# Patient Record
Sex: Male | Born: 2000 | Hispanic: Yes | Marital: Single | Smoking: Never smoker
Health system: Southern US, Community
[De-identification: ages and names within clinical notes are randomized; demographics above are authoritative.]

## PROBLEM LIST (undated history)

## (undated) DIAGNOSIS — K760 Fatty (change of) liver, not elsewhere classified: Secondary | ICD-10-CM

## (undated) DIAGNOSIS — L500 Allergic urticaria: Secondary | ICD-10-CM

## (undated) DIAGNOSIS — R0989 Other specified symptoms and signs involving the circulatory and respiratory systems: Secondary | ICD-10-CM

## (undated) DIAGNOSIS — K4090 Unilateral inguinal hernia, without obstruction or gangrene, not specified as recurrent: Secondary | ICD-10-CM

## (undated) DIAGNOSIS — R079 Chest pain, unspecified: Secondary | ICD-10-CM

## (undated) DIAGNOSIS — G43909 Migraine, unspecified, not intractable, without status migrainosus: Secondary | ICD-10-CM

## (undated) DIAGNOSIS — J029 Acute pharyngitis, unspecified: Secondary | ICD-10-CM

## (undated) DIAGNOSIS — J342 Deviated nasal septum: Secondary | ICD-10-CM

## (undated) HISTORY — PX: WISDOM TOOTH EXTRACTION: SHX21

## (undated) HISTORY — DX: Allergic urticaria: L50.0

---
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
## 2020-05-08 NOTE — Progress Notes (Signed)
NEUROLOGY FOLLOW UP OFFICE NOTE  Gerald Neal [id number]  HISTORY OF PRESENT ILLNESS: Gerald Neal is an 19 year old male follows up for migraine.  He is accompanied by his mother who supplements history.  UPDATE: Improved diet and sleep, which has helped.  No severe migraines.   Intensity:  Mild to moderate Duration:  30 minutes Frequency:  1 or 2 in past 30 days No recurrence of numbness.  Frequency of abortive medication: none Current NSAIDS:  ibuprofen Current analgesics:  none Current triptans:  sumatriptan '100mg'$  (never needed it) Current ergotamine:  none Current anti-emetic:  none Current muscle relaxants:  none Current anti-anxiolytic:  none Current sleep aide:  none Current Antihypertensive medications:  none Current Antidepressant medications:  none Current Anticonvulsant medications:  none Current anti-CGRP:  none Current Vitamins/Herbal/Supplements:  none Current Antihistamines/Decongestants:   none Other therapy:  none Hormone/birth control:  none Other medications:  none  Caffeine:  No coffee.  No soda Diet:  Drinks plenty of water Exercise:  yes Depression:  no; Anxiety:  no Other pain:  no Sleep hygiene:  Varies.  He is in college.  HISTORY: Onset:  He had migraines as a child up until 66 years old.  Those headaches were bitemporal and associated with nausea and vomiting.  He has had a recurrence of headaches at age 63.  They are different.  He loses unilateral eye sight (vision black) for 30 to 45 minutes, followed by gradual onset of left sided throbbing headache that then becomes diffuse and 8/10 intensity.  They typically last 3 hours.  There is no associated nausea, vomiting, photophobia, phonophobia, or unilateral numbness or weakness.  Physical exertion may be a trigger.  The first couple of times, it occurred while working in an [employer] and another time it occurred while working out.  Nothing really helps.  He has  had maybe up to 5 over the past 2 years.  He presented to the ED on 09/10/2019 because it would not abort.  He was afebrile and did not have abnormal neurologic exam.  Alk phos and t bili were mildly elevated at 142 and 1.3 respectively, but otherwise CBC, CMP, and UA were unremarkable.  Past NSAIDS:  none Past analgesics:  Tylenol Past abortive triptans:  none Past abortive ergotamine:  none Past muscle relaxants:  none Past anti-emetic:  none Past antihypertensive medications:  none Past antidepressant medications:  none Past anticonvulsant medications:  none Past anti-CGRP:  none Past vitamins/Herbal/Supplements:  none Past antihistamines/decongestants:  none Other past therapies:  none   Family history of headache:  No  Unrelated, he mentioned that he once had numbness and tingling around the left side of his lip lasting 10 minutes in November 2020.  No associated headache.  No recurrence.      PAST MEDICAL HISTORY: Past Medical History:  Diagnosis Date  . Deviated septum 08/2018  . Recurrent urticaria 01/21/2019  . Runny nose 09/18/2018   clear drainage, per mother  . Sore throat 09/18/2018    MEDICATIONS: Current Outpatient Medications on File Prior to Visit  Medication Sig Dispense Refill  . Sodium Fluoride 0.243 % PSTE Use as directed in the mouth or throat.    . SUMAtriptan (IMITREX) 100 MG tablet Take 1 tablet earliest onset of migraine.  May repeat in 2 hours if headache persists or recurs.  Maximum 2 tablets in 24 hours. 10 tablet 3   No current facility-administered medications on file prior to visit.  ALLERGIES: No Known Allergies  FAMILY HISTORY: Family History  Problem Relation Age of Onset  . Diabetes Maternal Grandfather   . Diabetes Paternal Grandfather   . Healthy Mother   . Healthy Father   . Healthy Sister   . Allergic rhinitis Neg Hx   . Asthma Neg Hx   . Eczema Neg Hx   . Urticaria Neg Hx    SOCIAL HISTORY: Social History    Socioeconomic History  . Marital status: Single    Spouse name: Not on file  . Number of children: 0  . Years of education: Not on file  . Highest education level: High school graduate  Occupational History  . Not on file  Tobacco Use  . Smoking status: Never Smoker  . Smokeless tobacco: Never Used  Substance and Sexual Activity  . Alcohol use: No  . Drug use: No  . Sexual activity: Never  Other Topics Concern  . Not on file  Social History Narrative   Pt is single lives with parents   He has no children   He currently is enrolled in college- high school graduate.   Right handed   Drinks coffee once month, no tea, no soda   Social Determinants of Health   Financial Resource Strain:   . Difficulty of Paying Living Expenses:   Food Insecurity:   . Worried About Charity fundraiser in the Last Year:   . Arboriculturist in the Last Year:   Transportation Needs:   . Film/video editor (Medical):   Marland Kitchen Lack of Transportation (Non-Medical):   Physical Activity:   . Days of Exercise per Week:   . Minutes of Exercise per Session:   Stress:   . Feeling of Stress :   Social Connections:   . Frequency of Communication with Friends and Family:   . Frequency of Social Gatherings with Friends and Family:   . Attends Religious Services:   . Active Member of Clubs or Organizations:   . Attends Archivist Meetings:   Marland Kitchen Marital Status:   Intimate Partner Violence:   . Fear of Current or Ex-Partner:   . Emotionally Abused:   Marland Kitchen Physically Abused:   . Sexually Abused:      PHYSICAL EXAM: Blood pressure 118/69, pulse 75, resp. rate 18, height '5\' 8"'$  (1.727 m), weight 152 lb (68.9 kg), SpO2 97 %. General: No acute distress.  Patient appears well-groomed.   Head:  Normocephalic/atraumatic Eyes:  Fundi examined but not visualized Neck: supple, no paraspinal tenderness, full range of motion Heart:  Regular rate and rhythm Lungs:  Clear to auscultation bilaterally Back:  No paraspinal tenderness Neurological Exam: alert and oriented to person, place, and time. Attention span and concentration intact, recent and remote memory intact, fund of knowledge intact.  Speech fluent and not dysarthric, language intact.  CN II-XII intact. Bulk and tone normal, muscle strength 5/5 throughout.  Sensation to light touch, temperature and vibration intact.  Deep tendon reflexes 2+ throughout, toes downgoing.  Finger to nose and heel to shin testing intact.  Gait normal, Romberg negative.  IMPRESSION: Migraine with aura, without status migrainosus, not intractable.  Stable Vague brief episode of numbness of his lips.  No recurrence.    PLAN: 1.  May use ibuprofen or sumatriptan if needed (which may be managed by his PCP). 2.  Follow up as needed.  Metta Clines, DO  CC: Faith Vanita Panda, MD

## 2020-05-11 ENCOUNTER — Ambulatory Visit (INDEPENDENT_AMBULATORY_CARE_PROVIDER_SITE_OTHER): Payer: Medicaid Other | Admitting: Neurology

## 2020-05-11 ENCOUNTER — Other Ambulatory Visit: Payer: Self-pay

## 2020-05-11 ENCOUNTER — Encounter: Payer: Self-pay | Admitting: Neurology

## 2020-05-11 VITALS — BP 118/69 | HR 75 | Resp 18 | Ht 68.0 in | Wt 152.0 lb

## 2020-05-11 DIAGNOSIS — G43109 Migraine with aura, not intractable, without status migrainosus: Secondary | ICD-10-CM | POA: Diagnosis not present

## 2020-05-11 NOTE — Patient Instructions (Signed)
Follow up as needed

## 2020-05-15 ENCOUNTER — Other Ambulatory Visit: Payer: Self-pay

## 2020-05-15 ENCOUNTER — Telehealth: Payer: Self-pay | Admitting: Neurology

## 2020-05-15 ENCOUNTER — Ambulatory Visit (INDEPENDENT_AMBULATORY_CARE_PROVIDER_SITE_OTHER): Payer: Medicaid Other

## 2020-05-15 DIAGNOSIS — G43109 Migraine with aura, not intractable, without status migrainosus: Secondary | ICD-10-CM

## 2020-05-15 MED ORDER — METOCLOPRAMIDE HCL 5 MG/ML IJ SOLN
10.0000 mg | Freq: Once | INTRAMUSCULAR | Status: AC
  Administered 2020-05-15: 10 mg via INTRAMUSCULAR

## 2020-05-15 MED ORDER — TOPIRAMATE 25 MG PO TABS: 25.0000 mg | ORAL_TABLET | Freq: Every day | ORAL | 0 refills | Status: DC

## 2020-05-15 MED ORDER — KETOROLAC TROMETHAMINE 60 MG/2ML IM SOLN
60.0000 mg | Freq: Once | INTRAMUSCULAR | Status: AC
  Administered 2020-05-15: 60 mg via INTRAMUSCULAR

## 2020-05-15 MED ORDER — DIPHENHYDRAMINE HCL 50 MG/ML IJ SOLN
25.0000 mg | Freq: Once | INTRAMUSCULAR | Status: AC
  Administered 2020-05-15: 25 mg via INTRAMUSCULAR

## 2020-05-15 NOTE — Telephone Encounter (Signed)
If it is mild, then he may retake a sumatriptan.  If it is not manageable, she may bring her son in for a headache cocktail (he needs a driver).  In the meantime, I will start him on a daily preventative, topiramate 25mg  at bedtime for a week, then increase to 50mg  at bedtime.  Side effects may cause numbness and tingling, so he shouldn't be concerned if he feels this.  He should be sure to keep hydrated.

## 2020-05-15 NOTE — Telephone Encounter (Signed)
Patient came by the office for a headache cocktail with a driver. Added to schedule.

## 2020-05-15 NOTE — Telephone Encounter (Signed)
Pt mother states that the patients had a headache for the last three days now. He only took the Sumatriptan once on the 18th. And Naxproxen. The pain is mild. Pt mother is worried how the pt feels, he unable to lift  his head, and pt states he has pressure on the left side of his head.     Pt mother wants to know what to do. Appt schedule to see Dr,. Jaffe on 05/19/20

## 2020-05-15 NOTE — Telephone Encounter (Signed)
Patient called in requesting another visit with Dr. Everlena Cooper. The patient was just here on 05/11/20. I have him scheduled on Monday 05/18/20, but I was not sure if there was something else that the patient could do or if Dr. Everlena Cooper wants to see him on Monday?

## 2020-05-18 ENCOUNTER — Ambulatory Visit (INDEPENDENT_AMBULATORY_CARE_PROVIDER_SITE_OTHER): Payer: Medicaid Other | Admitting: Neurology

## 2020-05-18 ENCOUNTER — Other Ambulatory Visit: Payer: Self-pay

## 2020-05-18 ENCOUNTER — Encounter: Payer: Self-pay | Admitting: Neurology

## 2020-05-18 VITALS — BP 110/60 | HR 72 | Ht 68.0 in | Wt 151.6 lb

## 2020-05-18 DIAGNOSIS — R200 Anesthesia of skin: Secondary | ICD-10-CM | POA: Diagnosis not present

## 2020-05-18 DIAGNOSIS — G43119 Migraine with aura, intractable, without status migrainosus: Secondary | ICD-10-CM

## 2020-05-18 MED ORDER — RIZATRIPTAN BENZOATE 10 MG PO TABS: 10.0000 mg | ORAL_TABLET | ORAL | 3 refills | Status: DC | PRN

## 2020-05-18 MED ORDER — ONDANSETRON HCL 4 MG PO TABS: 4.0000 mg | ORAL_TABLET | Freq: Three times a day (TID) | ORAL | 3 refills | Status: DC | PRN

## 2020-05-18 NOTE — Patient Instructions (Addendum)
1.  For preventative management, continue topiramate, titrating to 50mg  at bedtime 2.  For rescue therapy, take rizatriptan earliest onset of migraine.  May repeat in 2 hours if needed (maximum 2 tablets in 24 hours).  Take ondansetron for nausea.  Stop sumatriptan Check MRI of brain with and without contrast. We have sent a referral to [facility] for your MRI and they will call you directly to schedule your appointment. They are located at [address] [facility]. If you need to contact them directly please call [phone]. 3.  4.  Limit use of pain relievers to no more than 2 days out of week to prevent risk of rebound or medication-overuse headache. 5.  Keep headache diary 6.  Exercise, hydration, caffeine cessation, sleep hygiene, monitor for and avoid triggers 7.  Consider:  magnesium citrate 400mg  daily, riboflavin 400mg  daily, and coenzyme Q10 100mg  three times daily 8. Always keep in mind that currently taking a hormone or birth control may be a possible trigger or aggravating factor for migraine. 9. Follow up 4 months

## 2020-05-18 NOTE — Progress Notes (Signed)
NEUROLOGY FOLLOW UP OFFICE NOTE  Gerald Neal [id number]  HISTORY OF PRESENT ILLNESS: Gerald Neal.  He is accompanied by his mother who supplements history.  Following his follow up last week, he developed another intractable Neal.  It was severe left temporal pain lasting 2 to 3 days.  His mother said that his left eye looked "larger".  He tried sumatriptan once which was ineffective.  He vomited, which did not help.  He received a headache cocktail in our office on Friday, which helped.  He now has a dull residual left sided headache but manageable.  We started topiramate, titrating to 50mg  at bedtime.    PAST MEDICAL HISTORY: Past Medical History:  Diagnosis Date  . Deviated septum 08/2018  . Recurrent urticaria 01/21/2019  . Runny nose 09/18/2018   clear drainage, per mother  . Sore throat 09/18/2018    MEDICATIONS: Current Outpatient Medications on File Prior to Visit  Medication Sig Dispense Refill  . Sodium Fluoride 0.243 % PSTE Use as directed in the mouth or throat.    . SUMAtriptan (IMITREX) 100 MG tablet Take 1 tablet earliest onset of Neal.  May repeat in 2 hours if headache persists or recurs.  Maximum 2 tablets in 24 hours. 10 tablet 3  . topiramate (TOPAMAX) 25 MG tablet Take 1 tablet (25 mg total) by mouth at bedtime. 60 tablet 0   No current facility-administered medications on file prior to visit.    ALLERGIES: No Known Allergies  FAMILY HISTORY: Family History  Problem Relation Age of Onset  . Diabetes Maternal Grandfather   . Diabetes Paternal Grandfather   . Healthy Mother   . Healthy Father   . Healthy Sister   . Allergic rhinitis Neg Hx   . Asthma Neg Hx   . Eczema Neg Hx   . Urticaria Neg Hx     SOCIAL HISTORY: Social History   Socioeconomic History  . Marital status: Single    Spouse name: Not on file  . Number of children: 0  . Years of education: Not  on file  . Highest education level: High school graduate  Occupational History  . Not on file  Tobacco Use  . Smoking status: Never Smoker  . Smokeless tobacco: Never Used  Substance and Sexual Activity  . Alcohol use: No  . Drug use: No  . Sexual activity: Never  Other Topics Concern  . Not on file  Social History Narrative   Pt is single lives with parents   He has no children   He currently is enrolled in college- high school graduate.   Right handed   Drinks coffee once month, no tea, no soda   Social Determinants of Health   Financial Resource Strain:   . Difficulty of Paying Living Expenses:   Food Insecurity:   . Worried About Charity fundraiser in the Last Year:   . Arboriculturist in the Last Year:   Transportation Needs:   . Film/video editor (Medical):   Marland Kitchen Lack of Transportation (Non-Medical):   Physical Activity:   . Days of Exercise per Week:   . Minutes of Exercise per Session:   Stress:   . Feeling of Stress :   Social Connections:   . Frequency of Communication with Friends and Family:   . Frequency of Social Gatherings with Friends and Family:   . Attends Religious Services:   .  Active Member of Clubs or Organizations:   . Attends Banker Meetings:   Marland Kitchen Marital Status:   Intimate Partner Violence:   . Fear of Current or Ex-Partner:   . Emotionally Abused:   Marland Kitchen Physically Abused:   . Sexually Abused:     REVIEW OF SYSTEMS: Constitutional: No fevers, chills, or sweats, no generalized fatigue, change in appetite Eyes: No visual changes, double vision, eye pain Ear, nose and throat: No hearing loss, ear pain, nasal congestion, sore throat Cardiovascular: No chest pain, palpitations Respiratory:  No shortness of breath at rest or with exertion, wheezes GastrointestinaI: No nausea, vomiting, diarrhea, abdominal pain, fecal incontinence Genitourinary:  No dysuria, urinary retention or frequency Musculoskeletal:  No neck pain, back  pain Integumentary: No rash, pruritus, skin lesions Neurological: as above Psychiatric: No depression, insomnia, anxiety Endocrine: No palpitations, fatigue, diaphoresis, mood swings, change in appetite, change in weight, increased thirst Hematologic/Lymphatic:  No purpura, petechiae. Allergic/Immunologic: no itchy/runny eyes, nasal congestion, recent allergic reactions, rashes  PHYSICAL EXAM: Blood pressure 110/60, pulse 72, height 5\' 8"  (1.727 m), weight 151 lb 9.6 oz (68.8 kg), SpO2 98 %. General: No acute distress.  Patient appears well-groomed.    IMPRESSION: Neal with aura, without status migrainosus, intractable  PLAN: 1.  Started topiramate, titrating to 50mg  at bedtime 2.  For rescue, he will try rizatriptan 10mg .  Ondansetron 4mg  for nausea. 3.  Limit use of pain relievers to no more than 2 days out of week to prevent risk of rebound or medication-overuse headache. 4.  Keep headache diary 5.  Given the severity and intractability of these headaches, will check MRI of brain with and without contrast. 6.  Follow up in 4 months.  Total time spent with patient and mother:  15 minutes.  , DO  CC: , MD

## 2020-05-20 ENCOUNTER — Encounter: Payer: Self-pay | Admitting: *Deleted

## 2020-05-20 NOTE — Progress Notes (Signed)
[facility] (Key: [id number]) [phone] Need help? Call us at [phone] Status Sent to [employer] The plan will fax you a determination, typically within 1 to 5 business days.  How do I follow up? Drug Rizatriptan Benzoate 10MG  tablets Form [facility] Prior Approval Request for Standard Drug Request Form Pharmacy Prior Approval Request for Standard Drug Request Form for [place] Medicaid and [place] Health Choice [phone] Original Claim Info [phone]

## 2020-05-27 ENCOUNTER — Ambulatory Visit
Admission: RE | Admit: 2020-05-27 | Discharge: 2020-05-27 | Disposition: A | Discharge status: Home / Self Care | Payer: Medicaid Other | Source: Ambulatory Visit | Attending: Neurology | Admitting: Neurology

## 2020-05-27 DIAGNOSIS — G43119 Migraine with aura, intractable, without status migrainosus: Secondary | ICD-10-CM

## 2020-05-27 DIAGNOSIS — R200 Anesthesia of skin: Secondary | ICD-10-CM

## 2020-05-27 MED ORDER — GADOBENATE DIMEGLUMINE 529 MG/ML IV SOLN
14.0000 mL | Freq: Once | INTRAVENOUS | Status: AC | PRN
  Administered 2020-05-27: 14 mL via INTRAVENOUS

## 2020-05-28 ENCOUNTER — Telehealth: Payer: Self-pay

## 2020-05-28 NOTE — Telephone Encounter (Signed)
LMOVM 3:11pm

## 2020-05-28 NOTE — Telephone Encounter (Signed)
-----   Message from Drema Dallas, DO sent at 05/28/2020 10:06 AM EDT ----- MRI normal.

## 2020-05-29 NOTE — Progress Notes (Addendum)
Received form from [facility] that additional info was needed. Faxed over needed info to them. Awaiting return determination.   Confirmation #:[id number] FBenefit Plan:DHBADHealth Plan:[employer] Prior Approval [id number] [place] Type:PHARMACYRecipient:Diante Kathie Rhodes CASTILLOGONZALERecipient [id number]:[phone] KBilling Provider:Billing Provider PF:[id number] Provider Luretha Rued JAFFERequesting Provider HG:[phone] Submission Date:05/27/2021Status:APPROVEDEffective Begin Date:05/27/2021Effective End Date:05/22/2022Payer:[place] [employer]HS DIV OF HEALTH BENEFITS# of Attachments:0

## 2020-10-08 NOTE — Progress Notes (Signed)
NEUROLOGY FOLLOW UP OFFICE NOTE  Gerald Neal [id number]  HISTORY OF PRESENT ILLNESS: Gerald Neal. Gerald Neal is an 19 year old malefollows up for migraines.He is accompanied by his mother who supplements history.  UPDATE Due to intractable migraines, MRI of brain with and without contrast was performed on 05/27/2020, which was personally reviewed and was normal  He started changing his lifestyle, eating more healthy, increasing water intake.  He took off a semester from school which has helped.  He plans on returning in January.  He was averaging 2 headaches a month.  He decided to stop topiramate about a month ago and has not had any headaches.   Frequency of abortive medication: none Current NSAIDS:ibuprofen Current analgesics:none Current triptans:rizatriptan 10mg  Current ergotamine:none Current anti-emetic:Zofran 4mg  Current muscle relaxants:none Current anti-anxiolytic:none Current sleep aide:none Current Antihypertensive medications:none Current Antidepressant medications:none Current Anticonvulsant medications:topiramate 50mg  at bedtime Current anti-CGRP:none Current Vitamins/Herbal/Supplements:none Current Antihistamines/Decongestants:none Other therapy:none Hormone/birth control:none Other medications:none  HISTORY: Onset:He had migraines as a child up until 35 years old. Those headaches were bitemporal and associated with nausea and vomiting. He has had a recurrence of headaches at age 44. They are different. He loses unilateral eye sight (vision black) for 30 to 45 minutes, followed by gradual onset of left sided throbbing headache that then becomes diffuse and 8/10 intensity. They typically last 3 hours. There is no associated nausea, vomiting, photophobia, phonophobia, or unilateral numbness or weakness. Physical exertion may be a trigger. The first couple of times, it occurred while working in an  [employer] and another time it occurred while working out. Nothing really helps. He has had maybe up to 5 over the past 2 years.  He presented to the ED on 9/15/2020because it would not abort. He was afebrile and did not have abnormal neurologic exam. Alk phos and t bili were mildly elevated at 142 and 1.3 respectively, but otherwise CBC, CMP, and UA were unremarkable.  In May 2021, he developed another intractable migraine.  It was severe left temporal pain lasting 2 to 3 days.  His mother said that his left eye looked "larger".  He tried sumatriptan once which was ineffective.  He vomited, which did not help.  He received a headache cocktail in our office on Friday, which helped.  He now has a dull residual left sided headache but manageable.  We started topiramate, titrating to 50mg  at bedtime.  Past NSAIDS:none Past analgesics:Tylenol Past abortive triptans:sumatriptan 100mg  Past abortive ergotamine:none Past muscle relaxants:none Past anti-emetic:none Past antihypertensive medications:none Past antidepressant medications:none Past anticonvulsant medications:none Past anti-CGRP:none Past vitamins/Herbal/Supplements:none Past antihistamines/decongestants:none Other past therapies:none  Family history of headache:No  Unrelated, he mentioned that he once had numbness and tingling around the left side of his lip lasting 10 minutes in November 2020. No associated headache. No recurrence.     PAST MEDICAL HISTORY: Past Medical History:  Diagnosis Date   Deviated septum 08/2018   Recurrent urticaria 01/21/2019   Runny nose 09/18/2018   clear drainage, per mother   Sore throat 09/18/2018    MEDICATIONS: Current Outpatient Medications on File Prior to Visit  Medication Sig Dispense Refill   ondansetron (ZOFRAN) 4 MG tablet Take 1 tablet (4 mg total) by mouth every 8 (eight) hours as needed for nausea or vomiting. 20 tablet 3    rizatriptan (MAXALT) 10 MG tablet Take 1 tablet (10 mg total) by mouth as needed for migraine. May repeat in 2 hours if needed 10 tablet 3   Sodium  Fluoride 0.243 % PSTE Use as directed in the mouth or throat.     topiramate (TOPAMAX) 25 MG tablet Take 1 tablet (25 mg total) by mouth at bedtime. 60 tablet 0   No current facility-administered medications on file prior to visit.    ALLERGIES: No Known Allergies  FAMILY HISTORY: Family History  Problem Relation Age of Onset   Diabetes Maternal Grandfather    Diabetes Paternal Grandfather    Healthy Mother    Healthy Father    Healthy Sister    Allergic rhinitis Neg Hx    Asthma Neg Hx    Eczema Neg Hx    Urticaria Neg Hx     SOCIAL HISTORY: Social History   Socioeconomic History   Marital status: Single    Spouse name: Not on file   Number of children: 0   Years of education: Not on file   Highest education level: High school graduate  Occupational History   Not on file  Tobacco Use   Smoking status: Never Smoker   Smokeless tobacco: Never Used  Vaping Use   Vaping Use: Never used  Substance and Sexual Activity   Alcohol use: No   Drug use: No   Sexual activity: Never  Other Topics Concern   Not on file  Social History Narrative   Pt is single lives with parents   He has no children   He currently is enrolled in college- high school graduate.   Right handed   Drinks coffee once month, no tea, no soda   Social Determinants of Health   Financial Resource Strain:    Difficulty of Paying Living Expenses: Not on file  Food Insecurity:    Worried About [place] in the Last Year: Not on file   [employer] of Food in the Last Year: Not on file  Transportation Needs:    Lack of Transportation (Medical): Not on file   Lack of Transportation (Non-Medical): Not on file  Physical Activity:    Days of Exercise per Week: Not on file   Minutes of Exercise per Session: Not on file    Stress:    Feeling of Stress : Not on file  Social Connections:    Frequency of Communication with Friends and Family: Not on file   Frequency of Social Gatherings with Friends and Family: Not on file   Attends Religious Services: Not on file   Active Member of Clubs or Organizations: Not on file   Attends Archivist Meetings: Not on file   Marital Status: Not on file  Intimate Partner Violence:    Fear of Current or Ex-Partner: Not on file   Emotionally Abused: Not on file   Physically Abused: Not on file   Sexually Abused: Not on file    PHYSICAL EXAM: Blood pressure 127/73, pulse 66, resp. rate 18, height $RemoveBe'5\' 8"'goJFwbclq$  (1.727 m), weight 153 lb (69.4 kg), SpO2 95 %. General: No acute distress.  Patient appears well-groomed.     IMPRESSION: Migraine with aura, without status migrainosus, not intractable.  Doing well off medication.  Concern would be increased migraines once he returns to school  PLAN: 1.  Lifestyle modification:  Continue healthy diet, hydration, proper sleep hygiene, routine exercise, stress reduction 2.  Migraine rescue:  Rizatriptan $RemoveBefo'10mg'gzUlhFFXeYU$  with Zofran $RemoveBef'4mg'byTAqhXKrz$  3.  Limit use of pain relievers to no more than 2 days out of week to prevent risk of rebound or medication-overuse headache. 4.  Keep headache diary  5.  Follow up 4 to 6 months.  Metta Clines, DO  CC: Faith Vanita Panda, MD

## 2020-10-12 ENCOUNTER — Other Ambulatory Visit: Payer: Self-pay

## 2020-10-12 ENCOUNTER — Ambulatory Visit (INDEPENDENT_AMBULATORY_CARE_PROVIDER_SITE_OTHER): Payer: Medicaid Other | Admitting: Neurology

## 2020-10-12 ENCOUNTER — Encounter: Payer: Self-pay | Admitting: Neurology

## 2020-10-12 VITALS — BP 127/73 | HR 66 | Resp 18 | Ht 68.0 in | Wt 153.0 lb

## 2020-10-12 DIAGNOSIS — G43109 Migraine with aura, not intractable, without status migrainosus: Secondary | ICD-10-CM

## 2020-10-12 MED ORDER — RIZATRIPTAN BENZOATE 10 MG PO TABS: 10.0000 mg | ORAL_TABLET | ORAL | 5 refills | Status: DC | PRN

## 2020-10-12 MED ORDER — ONDANSETRON HCL 4 MG PO TABS: 4.0000 mg | ORAL_TABLET | Freq: Three times a day (TID) | ORAL | 5 refills | Status: DC | PRN

## 2020-10-12 NOTE — Patient Instructions (Signed)
  1. Take rizatriptan 10mg  at earliest onset of headache.  May repeat dose once in 2 hours if needed.  Maximum 2 tablets in 24 hours. 2. Ondansetron for nausea. 3. Limit use of pain relievers to no more than 2 days out of the week.  These medications include acetaminophen, NSAIDs (ibuprofen/Advil/Motrin, naproxen/Aleve, triptans (Imitrex/sumatriptan), Excedrin, and narcotics.  This will help reduce risk of rebound headaches. 4. Be aware of common food triggers:  - Caffeine:  coffee, black tea, cola, Mt. Dew  - Chocolate  - Dairy:  aged cheeses (brie, blue, cheddar, gouda, [place], provolone, [place], Swiss, etc), chocolate milk, buttermilk, sour cream, limit eggs and yogurt  - Nuts, peanut butter  - Alcohol  - Cereals/grains:  FRESH breads (fresh bagels, sourdough, doughnuts), yeast productions  - Processed/canned/aged/cured meats (pre-packaged deli meats, hotdogs)  - MSG/glutamate:  soy sauce, flavor enhancer, pickled/preserved/marinated foods  - Sweeteners:  aspartame (Equal, Nutrasweet).  Sugar and Splenda are okay  - Vegetables:  legumes (lima beans, lentils, snow peas, fava beans, pinto peans, peas, garbanzo beans), sauerkraut, onions, olives, pickles  - Fruit:  avocados, bananas, citrus fruit (orange, lemon, grapefruit), mango  - Other:  Frozen meals, macaroni and cheese 5. Routine exercise 6. Stay adequately hydrated (aim for 64 oz water daily) 7. Keep headache diary 8. Maintain proper stress management 9. Maintain proper sleep hygiene 10. Do not skip meals 11. Consider supplements:  magnesium citrate 400mg  daily, riboflavin 400mg  daily, coenzyme Q10 100mg  three times daily. 12. Follow up in 4 to 6 months.

## 2020-12-26 DIAGNOSIS — K219 Gastro-esophageal reflux disease without esophagitis: Secondary | ICD-10-CM

## 2020-12-26 HISTORY — DX: Gastro-esophageal reflux disease without esophagitis: K21.9

## 2021-01-15 IMAGING — MR MR HEAD WO/W CM
12 series · 48 of 48 positions shown · IV contrast (14 ml Multihance)
Comparison: None.

CLINICAL DATA: Migraine.

EXAM:
MRI HEAD WITHOUT AND WITH CONTRAST
TECHNIQUE: Multiplanar, multiecho pulse sequences of the brain and surrounding
structures were obtained without and with intravenous contrast.
CONTRAST:  14mL MULTIHANCE GADOBENATE DIMEGLUMINE 529 MG/ML IV SOLN

[Series 2: t1_se_sag · sagittal · 5.0mm · 0.45mm/px · 1 of 21 slices shown]
[im 1/21]
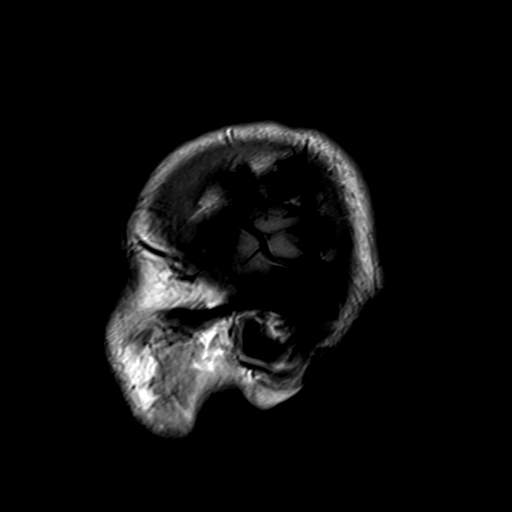

[Series 3: ep2d_diff_3 · axial · 3.0mm · 1.80mm/px · z∈[-29,+111]mm · 6 of 96 slices shown]
[im 1/96]
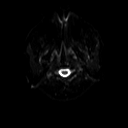
[im 20/96]
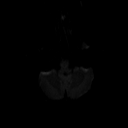
[im 39/96]
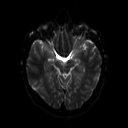
[im 58/96]
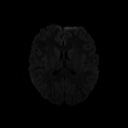
[im 77/96]
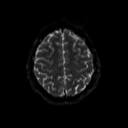
[im 96/96]
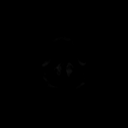

[Series 4: ep2d_diff_3_adc · axial · 3.0mm · 1.80mm/px · z∈[-29,+111]mm · 3 of 48 slices shown]
[im 1/48]
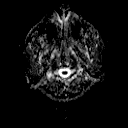
[im 24/48]
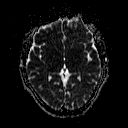
[im 48/48]
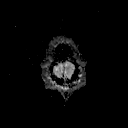

[Series 5: ep2d_diff_cor · coronal · 5.0mm · 1.77mm/px · 3 of 49 slices shown]
[im 1/49]
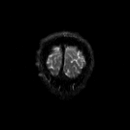
[im 25/49]
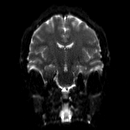
[im 49/49]
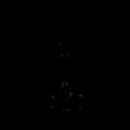

[Series 6: ep2d_diff_cor_adc · coronal · 5.0mm · 1.77mm/px · 2 of 25 slices shown]
[im 1/25]
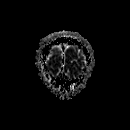
[im 25/25]
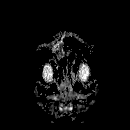

[Series 7: FLAIR · axial · 3.0mm · 0.45mm/px · z∈[-51,+134]mm · 2 of 32 slices shown]
[im 1/32]
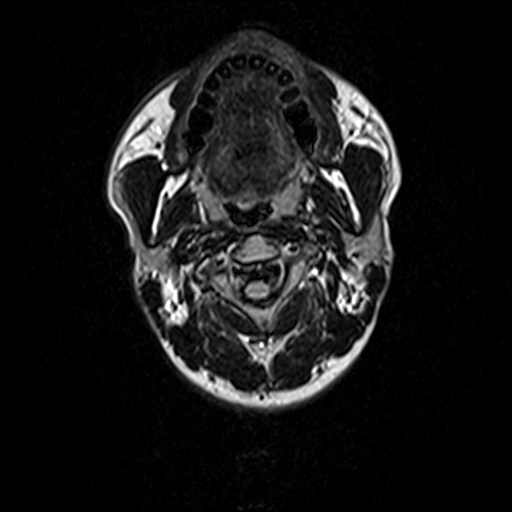
[im 32/32]
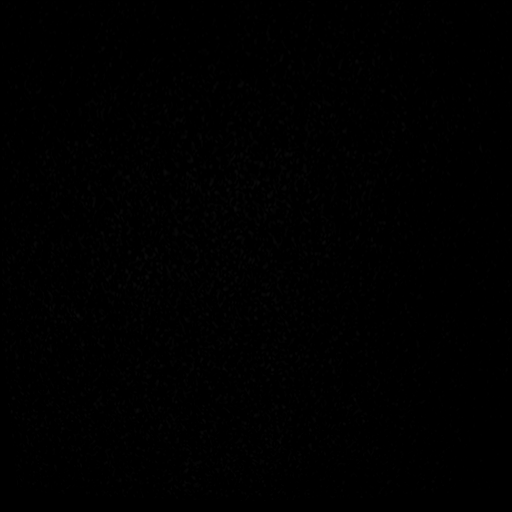

[Series 8: t2_tse_tra · axial · 5.0mm · 0.60mm/px · z∈[-33,+116]mm · 2 of 26 slices shown]
[im 1/26]
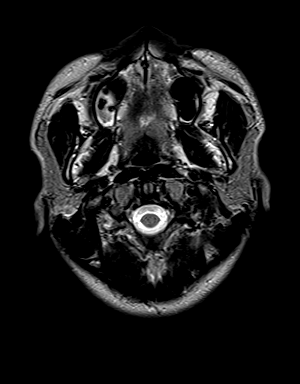
[im 26/26]
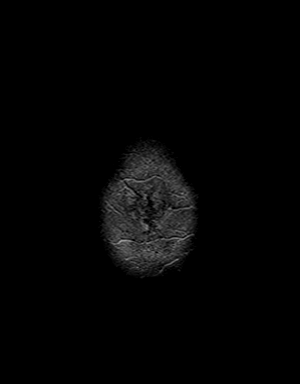

[Series 10: swi_images · axial · 2.0mm · 0.90mm/px · z∈[-37,+120]mm · 5 of 80 slices shown]
[im 1/80]
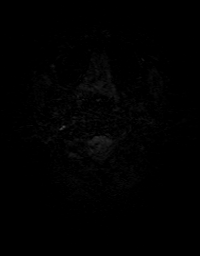
[im 20/80]
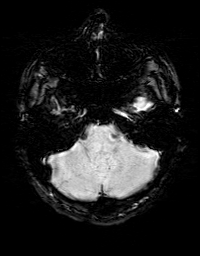
[im 40/80]
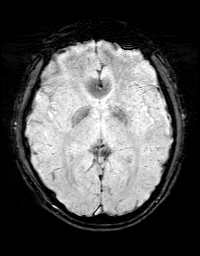
[im 60/80]
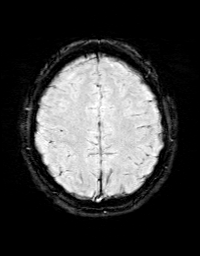
[im 80/80]
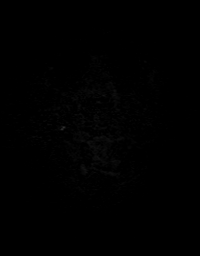

[Series 11: t1_mpr_tra · axial · 1.0mm · 0.72mm/px · z∈[-30,+112]mm · 10 of 144 slices shown]
[im 1/144]
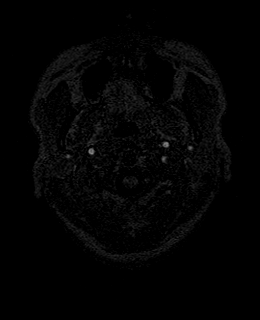
[im 16/144]
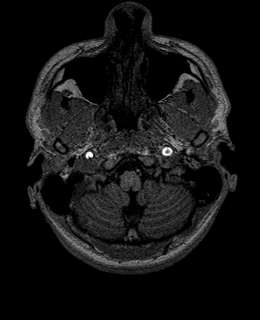
[im 32/144]
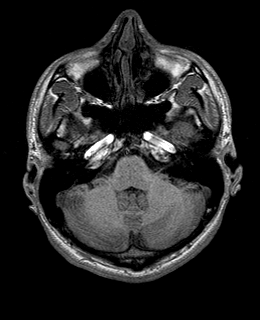
[im 48/144]
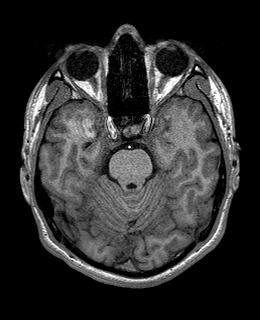
[im 64/144]
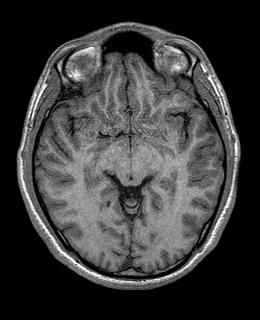
[im 80/144]
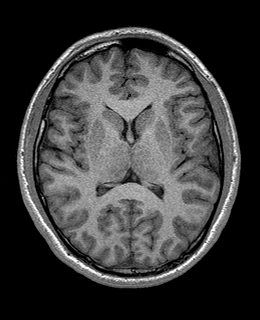
[im 96/144]
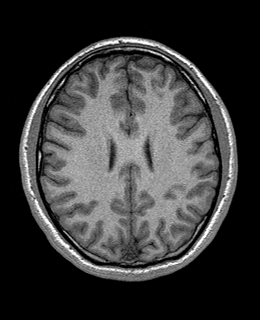
[im 112/144]
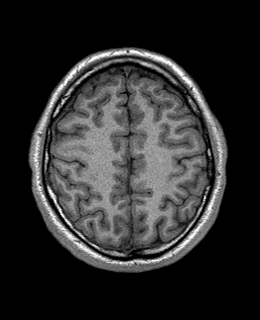
[im 128/144]
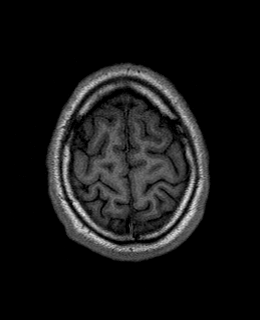
[im 144/144]
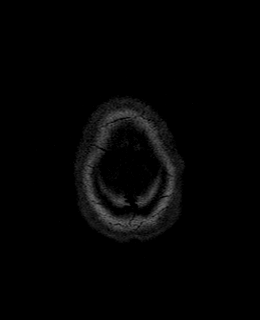

[Series 12: T2 post-contrast · coronal · 5.0mm · 0.45mm/px · 2 of 30 slices shown]
[im 1/30]
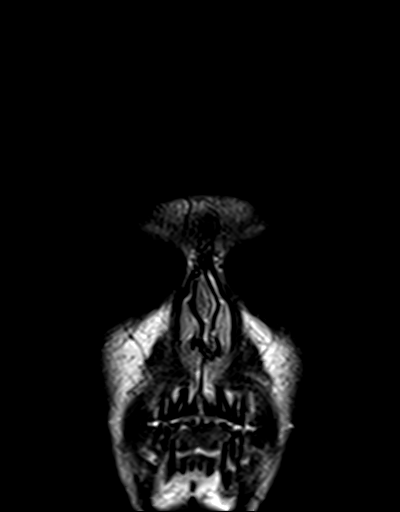
[im 30/30]
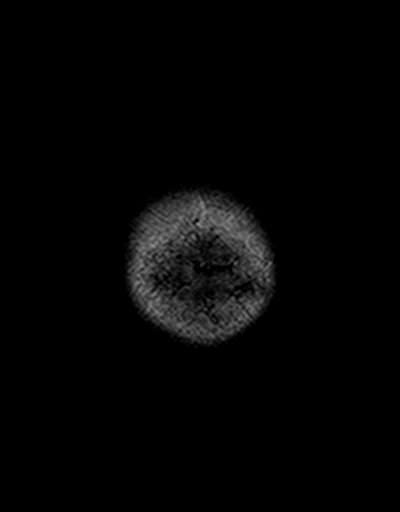

[Series 13: post t1_mpr_tra · axial · 1.0mm · 0.72mm/px · z∈[-30,+112]mm · 10 of 144 slices shown]
[im 1/144]
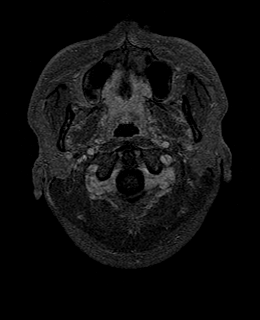
[im 16/144]
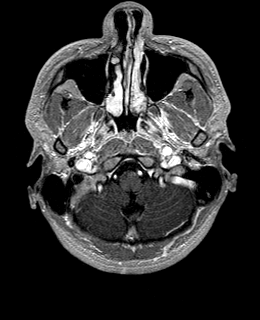
[im 32/144]
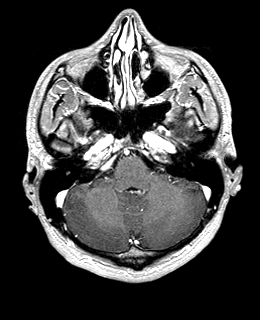
[im 48/144]
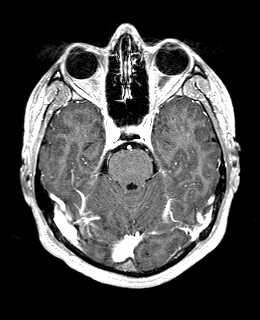
[im 64/144]
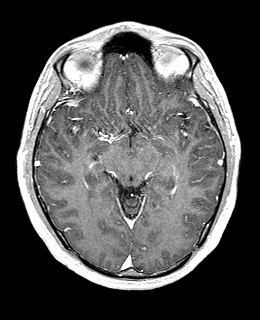
[im 80/144]
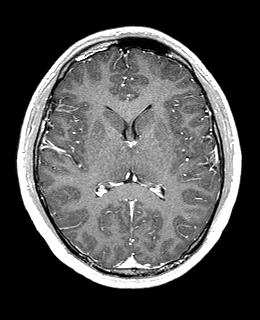
[im 96/144]
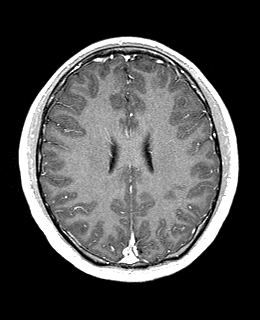
[im 112/144]
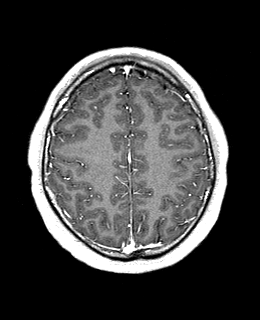
[im 128/144]
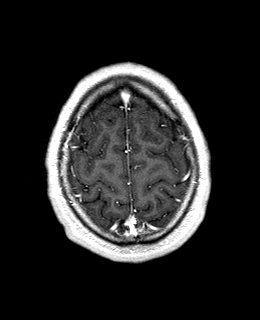
[im 144/144]
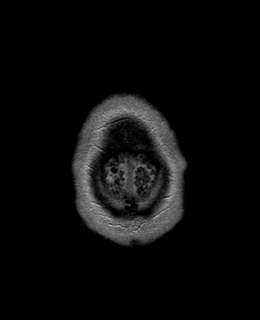

[Series 14: T1 post-contrast · coronal · 5.0mm · 0.72mm/px · 2 of 30 slices shown]
[im 1/30]
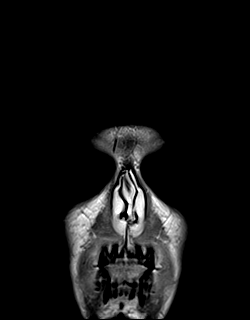
[im 30/30]
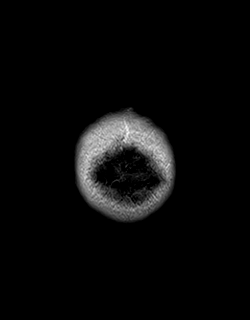

[48 of 48 positions shown; findings below may reference images not displayed]

FINDINGS: Brain: No focal parenchymal signal abnormality. No acute infarct or
intracranial hemorrhage. No midline shift, ventriculomegaly or
extra-axial fluid collection. No mass lesion. No abnormal
enhancement.

Vascular: Normal flow voids.

Skull and upper cervical spine: Normal marrow signal.

Sinuses/Orbits: Normal orbits. Clear paranasal sinuses. No mastoid
effusion.

Other: None.
IMPRESSION: Normal MRI brain.

## 2021-03-04 DIAGNOSIS — F321 Major depressive disorder, single episode, moderate: Secondary | ICD-10-CM | POA: Diagnosis not present

## 2021-04-06 ENCOUNTER — Ambulatory Visit (INDEPENDENT_AMBULATORY_CARE_PROVIDER_SITE_OTHER): Payer: Medicaid Other | Admitting: Allergy & Immunology

## 2021-04-06 ENCOUNTER — Other Ambulatory Visit: Payer: Self-pay

## 2021-04-06 ENCOUNTER — Encounter: Payer: Self-pay | Admitting: Allergy & Immunology

## 2021-04-06 VITALS — BP 118/68 | HR 81 | Temp 98.6°F | Resp 18 | Ht 68.0 in | Wt 149.0 lb

## 2021-04-06 DIAGNOSIS — N489 Disorder of penis, unspecified: Secondary | ICD-10-CM | POA: Diagnosis not present

## 2021-04-06 DIAGNOSIS — T7840XD Allergy, unspecified, subsequent encounter: Secondary | ICD-10-CM

## 2021-04-06 DIAGNOSIS — F321 Major depressive disorder, single episode, moderate: Secondary | ICD-10-CM | POA: Diagnosis not present

## 2021-04-06 MED ORDER — DESONIDE 0.05 % EX CREA: TOPICAL_CREAM | Freq: Two times a day (BID) | CUTANEOUS | 0 refills | Status: DC

## 2021-04-06 NOTE — Patient Instructions (Addendum)
1. Genital lesions - I would recommend getting latex free condoms and seeing if those work (this would answer whether this is latex or not).  - Try using desonide on the lesions twice daily for one week max to see if this helps. - Make an appointment for latex testing in 6 weeks and cancel this if the condom experiment works.   2. Return in about 6 weeks (around 05/18/2021) for LATEX TESTING.    Please inform us of any Emergency Department visits, hospitalizations, or changes in symptoms. Call us before going to the ED for breathing or allergy symptoms since we might be able to fit you in for a sick visit. Feel free to contact us anytime with any questions, problems, or concerns.  It was a pleasure to meet you today!  Websites that have reliable patient information: 1. American Academy of Asthma, Allergy, and Immunology: www.aaaai.org 2. Food Allergy Research and Education (FARE): foodallergy.org 3. Mothers of Asthmatics: http://www.asthmacommunitynetwork.org 4. American College of Allergy, Asthma, and Immunology: www.acaai.org   COVID-19 Vaccine Information can be found at: [email or web address] For questions related to vaccine distribution or appointments, please email [email or web address] or call [phone].   We realize that you might be concerned about having an allergic reaction to the COVID19 vaccines. To help with that concern, WE ARE OFFERING THE COVID19 VACCINES IN OUR OFFICE! Ask the front desk for dates!     "Like" [place] on Facebook and Instagram for our latest updates!      A healthy democracy works best when [employer] participate! Make sure you are registered to vote! If you have moved or changed any of your contact information, you will need to get this updated before voting!  In some cases, you MAY be able to register to vote online: [email or web address]

## 2021-04-06 NOTE — Progress Notes (Signed)
FOLLOW UP  Date of Service/Encounter:  04/06/21   Assessment:   Allergic reaction - with concern for latex allergy  Penile lesions  Plan/Recommendations:   1. Genital lesions - I would recommend getting latex free condoms and seeing if those work (this would answer whether this is latex or not).  - Try using desonide on the lesions twice daily for one week max to see if this helps. - Make an appointment for latex testing in 6 weeks and cancel this if the condom experiment works.   2. Return in about 6 weeks (around 05/18/2021) for LATEX TESTING.    Subjective:   Gerald Neal is a 20 y.o. male presenting today for follow up of  Chief Complaint  Patient presents with  . Rash    Gerald Neal has a history of the following: Patient Active Problem List   Diagnosis Date Noted  . Recurrent urticaria 01/21/2019  . Chronic rhinitis 01/21/2019  . History of penicillin allergy 01/21/2019  . Allergic reaction 01/21/2019  . Partial bowel obstruction (HCC) 11/11/2016    History obtained from: chart review and patient.  Gerald Neal is a 20 y.o. male presenting for a sick visit.  He was last seen in March 2020 by Dr. Nunzio Cobbs.  At that time, he underwent a penicillin challenge which he passed.  Since last visit, he has done well from an urticaria perspective. He has not had any breakthrough urticarial outbreaks and these have since resolved.   His issue today revolves around concern for a latex allergy.  He tells me that in September 2021, he went to see his girlfriend and use condom for the first time.  After using the condom, he noticed some bumps around the tip of his penis.  They were not blistery or scaly.  They were red with water-like bumps. They lasted for approximately 1 week after they first started.  He did not use anything on them.  A similar event occurred in January 2022 and the rash again lasted for 1 week.  He says the rash was pruritic and painful.   He did not use any kind of lubricant.  He has never used latex gloves to his knowledge. He did not receive any oral sex on that occasion, although he did earlier that month. There were days between the oral sex and the condom episode. This has essentially happened every time that he had sex with his girlfriend. He has NOT tried using a different type of condom. He reports that the condoms do not explicitly say that they are latex free.    He does show me pictures and actually looks like herpetic lesions.  He tells me he had a couple of STD tests performed and these were all negative.  It is unclear what exactly was done since this is not in our electronic records.  Otherwise, there have been no changes to his past medical history, surgical history, family history, or social history.    Review of Systems  Constitutional: Negative.  Negative for chills, fever, malaise/fatigue and weight loss.  HENT: Negative.  Negative for congestion, ear discharge and ear pain.   Eyes: Negative for pain, discharge and redness.  Respiratory: Negative for cough, sputum production, shortness of breath and wheezing.   Cardiovascular: Negative.  Negative for chest pain and palpitations.  Gastrointestinal: Negative for abdominal pain, heartburn, nausea and vomiting.  Skin: Positive for itching and rash.  Neurological: Negative for dizziness and headaches.  Endo/Heme/Allergies: Negative for environmental  allergies. Does not bruise/bleed easily.       Objective:   Blood pressure 118/68, pulse 81, temperature 98.6 F (37 C), temperature source Temporal, resp. rate 18, height 5\' 8"  (1.727 m), weight 149 lb (67.6 kg), SpO2 96 %. Body mass index is 22.66 kg/m.   Physical Exam:  Physical Exam Constitutional:      Appearance: He is well-developed.     Comments: Talkative male.  HENT:     Head: Normocephalic and atraumatic.     Right Ear: Tympanic membrane, ear canal and external ear normal.     Left Ear:  Tympanic membrane, ear canal and external ear normal.     Nose: No nasal deformity, septal deviation, mucosal edema or rhinorrhea.     Right Sinus: No maxillary sinus tenderness or frontal sinus tenderness.     Left Sinus: No maxillary sinus tenderness or frontal sinus tenderness.     Mouth/Throat:     Mouth: Mucous membranes are not pale and not dry.     Pharynx: Uvula midline.  Eyes:     General:        Right eye: No discharge.        Left eye: No discharge.     Conjunctiva/sclera: Conjunctivae normal.     Right eye: Right conjunctiva is not injected. No chemosis.    Left eye: Left conjunctiva is not injected. No chemosis.    Pupils: Pupils are equal, round, and reactive to light.  Cardiovascular:     Rate and Rhythm: Normal rate and regular rhythm.     Heart sounds: Normal heart sounds.  Pulmonary:     Effort: Pulmonary effort is normal. No tachypnea, accessory muscle usage or respiratory distress.     Breath sounds: Normal breath sounds. No wheezing, rhonchi or rales.  Chest:     Chest wall: No tenderness.  Genitourinary:    Comments: (seen via phone pics): multiple vesicular lesions around the glans of the penis with surrounding erythema. The shaft itself was not affected. No active drainage, although they did appear to be fluid filled vesicles. Lymphadenopathy:     Cervical: No cervical adenopathy.  Skin:    Coloration: Skin is not pale.     Findings: No abrasion, erythema, petechiae or rash. Rash is not papular, urticarial or vesicular.  Neurological:     Mental Status: He is alert.      Diagnostic studies: none     , MD  [facility]

## 2021-04-08 ENCOUNTER — Encounter: Payer: Self-pay | Admitting: Allergy & Immunology

## 2021-04-14 ENCOUNTER — Ambulatory Visit (INDEPENDENT_AMBULATORY_CARE_PROVIDER_SITE_OTHER): Payer: Medicaid Other | Admitting: Family Medicine

## 2021-04-14 ENCOUNTER — Encounter: Payer: Self-pay | Admitting: Family Medicine

## 2021-04-14 ENCOUNTER — Other Ambulatory Visit: Payer: Self-pay

## 2021-04-14 VITALS — BP 110/72 | HR 64 | Temp 99.3°F

## 2021-04-14 DIAGNOSIS — T7840XD Allergy, unspecified, subsequent encounter: Secondary | ICD-10-CM

## 2021-04-14 DIAGNOSIS — T7849XD Other allergy, subsequent encounter: Secondary | ICD-10-CM

## 2021-04-14 NOTE — Progress Notes (Addendum)
[address] Debbora Presto [place] [address] Dept: [phone]  FOLLOW UP NOTE  Patient ID: NAASIR CARREIRA, male    DOB: 25-Oct-2001  Age: 20 y.o. MRN: [id number] Date of Office Visit: 04/14/2021  Assessment  Chief Complaint: Food/Drug Challenge (Latex)  HPI Hitoshi Werts Ege is a 20 year old male who presents to the clinic for latex skin testing.  He was last seen in this clinic on 04/06/2021 by Dr. Dellis Anes for evaluation of latex allergy.  At today's visit, he reports he is feeling well overall with no integumentary, cardiopulmonary, or gastrointestinal symptoms.  He does not normally take an antihistamine and has not taken an antihistamine over the last 3 days.  He denies any further exposure to latex since his last visit to this clinic. He reports that he has not used a latex free condom or a latex condom in the interval since his last visit to this clinic. Current medications are listed in the chart.   Drug Allergies:  No Known Allergies  Physical Exam: BP 110/72 (BP Location: Left Arm, Patient Position: Sitting, Cuff Size: Normal)   Pulse 64   Temp 99.3 F (37.4 C) (Temporal)   SpO2 99%    Physical Exam Vitals reviewed.  Constitutional:      Appearance: Normal appearance.  HENT:     Head: Normocephalic and atraumatic.     Right Ear: Tympanic membrane normal.     Left Ear: Tympanic membrane normal.     Nose:     Comments: Bilateral nares normal.  Pharynx normal.  Ears normal.  Eyes normal.    Mouth/Throat:     Pharynx: Oropharynx is clear.  Eyes:     Conjunctiva/sclera: Conjunctivae normal.  Cardiovascular:     Rate and Rhythm: Normal rate and regular rhythm.     Heart sounds: Normal heart sounds. No murmur heard.   Pulmonary:     Effort: Pulmonary effort is normal.     Breath sounds: Normal breath sounds.     Comments: Lungs clear to auscultation Musculoskeletal:        General: Normal range of motion.     Cervical back: Normal range of  motion and neck supple.  Skin:    General: Skin is warm and dry.  Neurological:     Mental Status: He is alert and oriented to person, place, and time.  Psychiatric:        Mood and Affect: Mood normal.        Behavior: Behavior normal.        Thought Content: Thought content normal.        Judgment: Judgment normal.      Procedure note: Written consent obtained.  Histamine was 3+ and the following doses were negative including: control, Latex 12/998, wet glove solution, wet glove, dry glove, Latex 1/100, and Latex 1/10. The patient was able to tolerate the skin testing without adverse reaction. Physical exam remained within normal limits.   Assessment and Plan: 1. Allergic reaction, subsequent encounter     Patient Instructions  Latex skin testing Rihan Pardon was able to tolerate the latex skin testing today at the office without adverse signs or symptoms of an allergic reaction. Therefore, he has the same risk of systemic reaction associated with the use of products containing latex as the general population.  Continue to monitor for allergic symptoms such as rash, wheezing, diarrhea, swelling, and vomiting for the next 24 hours. If severe symptoms occur call 911. For less severe symptoms treat with  Benadryl 4 teaspoonfuls every 6 hours and call the clinic.   Call the clinic if this treatment plan is not working well for you  Follow up as needed   Return if symptoms worsen or fail to improve.    Thank you for the opportunity to care for this patient.  Please do not hesitate to contact me with questions.  Thermon Leyland, FNP [facility]

## 2021-04-14 NOTE — Patient Instructions (Addendum)
[name] was able to tolerate the latex skin testing today at the office without adverse signs or symptoms of an allergic reaction. Therefore, he has the same risk of systemic reaction associated with the use of products containing latex as the general population.  Continue to monitor for allergic symptoms such as rash, wheezing, diarrhea, swelling, and vomiting for the next 24 hours. If severe symptoms occur call 911. For less severe symptoms treat with Benadryl 4 teaspoonfuls every 6 hours and call the clinic.   Call the clinic if this treatment plan is not working well for you  Follow up as needed

## 2021-04-19 NOTE — Progress Notes (Signed)
Virtual Visit via Video Note The purpose of this virtual visit is to provide medical care while limiting exposure to the novel coronavirus.    Consent was obtained for video visit:  yes Answered questions that patient had about telehealth interaction:  yes I discussed the limitations, risks, security and privacy concerns of performing an evaluation and management service by telemedicine. I also discussed with the patient that there may be a patient responsible charge related to this service. The patient expressed understanding and agreed to proceed.  Pt location: Home Physician Location: office Name of referring provider:  Wende Neighbors,* I connected with Gerald Neal at patients initiation/request on 04/20/2021 at  3:30 PM EDT by video enabled telemedicine application and verified that I am speaking with the correct person using two identifiers. Pt MRN:  [id number] Pt DOB:  [birth year] Video Participants:  Gerald Neal  Assessment and Plan:   1.  Migraine with aura, without status migrainosus, not intractable 2.  Episodic tension type headache  1.  Limit use of ibuprofen to no more than 2 days out of week to prevent rebound headache 2.  Migraine rescue:  Rizatriptan 10mg  with Zofran 4mg  3.  Limit use of pain relievers to no more than 2 days out of week to prevent risk of rebound or medication-overuse headache.  4.  Keep headache diary 5.  Follow up with optometrist/ophthalmologist for formal eye exam to see if he needs new prescription 6.  Follow up with me in 6 months.  History of Present Illness:  Gerald Neal. Gerald Neal is an 20 year old malefollows up for migraines.He is accompanied by his mother who supplements history.  UPDATE No migraines in 2 months.  It quickly aborted with rizatriptan.  He may have a dull nonthrobbing bi-frontal/temporal headache for a couple of hours about 3 times a week, usually after he works out.  Treats with  ibuprofen.  Sometimes if he is doing homework late at night, his eyes strain and gets some eye pain.  Blue light filter makes headaches worse.  No recent eye exam.   Frequency of abortive medication:none Current NSAIDS:ibuprofen Current analgesics:none Current triptans:rizatriptan 10mg  Current ergotamine:none Current anti-emetic:Zofran 4mg  Current muscle relaxants:none Current anti-anxiolytic:none Current sleep aide:none Current Antihypertensive medications:none Current Antidepressant medications:none Current Anticonvulsant medications:topiramate 50mg  at bedtime Current anti-CGRP:none Current Vitamins/Herbal/Supplements:none Current Antihistamines/Decongestants:none Other therapy:none Hormone/birth control:none Other medications:none  HISTORY: Onset:He had migraines as a child up until 32 years old. Those headaches were bitemporal and associated with nausea and vomiting. He has had a recurrence of headaches at age 52. They are different. He loses unilateral eye sight (vision black) for 30 to 45 minutes, followed by gradual onset of left sided throbbing headache that then becomes diffuse and 8/10 intensity. They typically last 3 hours. There is no associated nausea, vomiting, photophobia, phonophobia, or unilateral numbness or weakness. Physical exertion may be a trigger. The first couple of times, it occurred while working in an [employer] and another time it occurred while working out. Nothing really helps. He has had maybe up to 5 over the past 2 years.  He presented to the ED on 9/15/2020because it would not abort. He was afebrile and did not have abnormal neurologic exam. Alk phos and t bili were mildly elevated at 142 and 1.3 respectively, but otherwise CBC, CMP, and UA were unremarkable.  In May 2021, he developed another intractable migraine. It was severe left temporal pain lasting 2 to 3 days. His mother said that  his  left eye looked "larger". He tried sumatriptan once which was ineffective. He vomited, which did not help. He received a headache cocktail in our office on Friday, which helped. MRI of brain with and without contrast was performed on 05/27/2020, which was personally reviewed and was normal  Past NSAIDS:none Past analgesics:Tylenol Past abortive triptans:sumatriptan 100mg  Past abortive ergotamine:none Past muscle relaxants:none Past anti-emetic:none Past antihypertensive medications:none Past antidepressant medications:none Past anticonvulsant medications:topiramate (no problems) Past anti-CGRP:none Past vitamins/Herbal/Supplements:none Past antihistamines/decongestants:none Other past therapies:none  Family history of headache:No  Unrelated, he mentioned that he once had numbness and tingling around the left side of his lip lasting 10 minutesin November 2020. No associated headache. No recurrence.  Past Medical History: Past Medical History:  Diagnosis Date  . Deviated septum 08/2018  . Recurrent urticaria 01/21/2019  . Runny nose 09/18/2018   clear drainage, per mother  . Sore throat 09/18/2018    Medications: Outpatient Encounter Medications as of 04/20/2021  Medication Sig  . desonide (DESOWEN) 0.05 % cream Apply topically 2 (two) times daily.  . ondansetron (ZOFRAN) 4 MG tablet Take 1 tablet (4 mg total) by mouth every 8 (eight) hours as needed for nausea or vomiting.  . rizatriptan (MAXALT) 10 MG tablet Take 1 tablet (10 mg total) by mouth as needed for migraine. May repeat in 2 hours if needed.  Maximum 2 tablets in 24 hours.  . Sodium Fluoride 0.243 % PSTE Use as directed in the mouth or throat.   No facility-administered encounter medications on file as of 04/20/2021.    Allergies: No Known Allergies  Family History: Family History  Problem Relation Age of Onset  . Diabetes Maternal Grandfather   . Diabetes Paternal  Grandfather   . Healthy Mother   . Healthy Father   . Healthy Sister   . Allergic rhinitis Neg Hx   . Asthma Neg Hx   . Eczema Neg Hx   . Urticaria Neg Hx     Observations/Objective:   Height 5\' 8"  (1.727 m), weight 152 lb (68.9 kg). No acute distress.  Alert and oriented.  Speech fluent and not dysarthric.  Language intact.   Follow Up Instructions:    -I discussed the assessment and treatment plan with the patient. The patient was provided an opportunity to ask questions and all were answered. The patient agreed with the plan and demonstrated an understanding of the instructions.   The patient was advised to call back or seek an in-person evaluation if the symptoms worsen or if the condition fails to improve as anticipated.    Dudley Major, DO

## 2021-04-20 ENCOUNTER — Other Ambulatory Visit: Payer: Self-pay

## 2021-04-20 ENCOUNTER — Encounter: Payer: Self-pay | Admitting: Neurology

## 2021-04-20 ENCOUNTER — Telehealth (INDEPENDENT_AMBULATORY_CARE_PROVIDER_SITE_OTHER): Payer: Medicaid Other | Admitting: Neurology

## 2021-04-20 VITALS — Ht 68.0 in | Wt 152.0 lb

## 2021-04-20 DIAGNOSIS — G44219 Episodic tension-type headache, not intractable: Secondary | ICD-10-CM | POA: Diagnosis not present

## 2021-04-20 DIAGNOSIS — G43109 Migraine with aura, not intractable, without status migrainosus: Secondary | ICD-10-CM | POA: Diagnosis not present

## 2021-06-21 ENCOUNTER — Other Ambulatory Visit: Payer: Self-pay | Admitting: Neurology

## 2021-07-21 DIAGNOSIS — N5312 Painful ejaculation: Secondary | ICD-10-CM | POA: Insufficient documentation

## 2021-10-08 DIAGNOSIS — R17 Unspecified jaundice: Secondary | ICD-10-CM | POA: Diagnosis not present

## 2021-10-08 DIAGNOSIS — R21 Rash and other nonspecific skin eruption: Secondary | ICD-10-CM | POA: Diagnosis not present

## 2021-10-08 DIAGNOSIS — R945 Abnormal results of liver function studies: Secondary | ICD-10-CM | POA: Diagnosis not present

## 2021-10-20 NOTE — Progress Notes (Signed)
NEUROLOGY FOLLOW UP OFFICE NOTE  LORIN GAWRON [id number]  Assessment/Plan:   Migraine with aura, without status migrainosus, not intractable   Migraine prevention:  Deferred as headaches are infrequent Migraine rescue:  ibuprofen or rizatriptan 29m, Zofran 424mLimit use of pain relievers to no more than 2 days out of week to prevent risk of rebound or medication-overuse headache. Keep headache diary Follow up one year   Subjective:  BrQuinton VothCaIline Ovens an 20ear old male follows up for migraines.  He is accompanied by his mother who supplements history.   UPDATE 1 migraine in last 30 days.  It quickly aborted with rizatriptan or ibuprofen.  he had one headache with right eye pain.  Frequency of abortive medication: none Current NSAIDS:  ibuprofen Current analgesics:  none Current triptans:  rizatriptan 1023murrent ergotamine:  none Current anti-emetic:  Zofran 4mg87mrrent muscle relaxants:  none Current anti-anxiolytic:  none Current sleep aide:  none Current Antihypertensive medications:  none Current Antidepressant medications:  none Current Anticonvulsant medications:  none Current anti-CGRP:  none Current Vitamins/Herbal/Supplements:  none Current Antihistamines/Decongestants:   none Other therapy:  none Hormone/birth control:  none Other medications:  none   HISTORY: Onset:  He had migraines as a child up until 20 y77rs old.  Those headaches were bitemporal and associated with nausea and vomiting.  He has had a recurrence of headaches at age 20. 62hey are different.  He loses unilateral eye sight (vision black) for 30 to 45 minutes, followed by gradual onset of left sided throbbing headache that then becomes diffuse and 8/10 intensity.  They typically last 3 hours.  There is no associated nausea, vomiting, photophobia, phonophobia, or unilateral numbness or weakness.  Physical exertion, eye strain and blue light filter may be a trigger.  The  first couple of times, it occurred while working in an [employer] another time it occurred while working out.  Nothing really helps.  He has had maybe up to 5 over the past 2 years.   He presented to the ED on 09/10/2019 because it would not abort.  He was afebrile and did not have abnormal neurologic exam.  Alk phos and t bili were mildly elevated at 142 and 1.3 respectively, but otherwise CBC, CMP, and UA were unremarkable.   In May 20, he developed another intractable migraine.  It was severe left temporal pain lasting 2 to 3 days.  His mother said that his left eye looked "larger".  He tried sumatriptan once which was ineffective.  He vomited, which did not help.  He received a headache cocktail in our office on Friday, which helped.  MRI of brain with and without contrast was performed on 05/27/2020, which was personally reviewed and was normal  For several years, he has had episodes of numbness over his lip.  They occur infrequently.  Unsure if they correlate with migraine.   Past NSAIDS:  ibuprofen Past analgesics:  Tylenol Past abortive triptans:  sumatriptan 100mg68mt abortive ergotamine:  none Past muscle relaxants:  none Past anti-emetic:  Zofran Past antihypertensive medications:  none Past antidepressant medications:  none Past anticonvulsant medications:  topiramate (no problems) Past anti-CGRP:  none Past vitamins/Herbal/Supplements:  none Past antihistamines/decongestants:  none Other past therapies:  none   Family history of headache:  No  PAST MEDICAL HISTORY: Past Medical History:  Diagnosis Date   Deviated septum 08/2018   Recurrent urticaria 01/21/2019   Runny nose 09/18/2018  clear drainage, per mother   Sore throat 09/18/2018    MEDICATIONS: Current Outpatient Medications on File Prior to Visit  Medication Sig Dispense Refill   desonide (DESOWEN) 0.05 % cream Apply topically 2 (two) times daily. (Patient not taking: Reported on 04/20/2021) 30 g 0    ondansetron (ZOFRAN) 4 MG tablet Take 1 tablet (4 mg total) by mouth every 8 (eight) hours as needed for nausea or vomiting. 20 tablet 5   rizatriptan (MAXALT) 10 MG tablet TAKE 1 TABLET BY MOUTH AS NEEDED FOR MIGRAINE. MAY REPEAT IN 2 HOURS IF NEEDED 10 tablet 2   Sodium Fluoride 0.243 % PSTE Use as directed in the mouth or throat. (Patient not taking: Reported on 04/20/2021)     No current facility-administered medications on file prior to visit.    ALLERGIES: No Known Allergies  FAMILY HISTORY: Family History  Problem Relation Age of Onset   Diabetes Maternal Grandfather    Diabetes Paternal Grandfather    Healthy Mother    Healthy Father    Healthy Sister    Allergic rhinitis Neg Hx    Asthma Neg Hx    Eczema Neg Hx    Urticaria Neg Hx       Objective:  Blood pressure 105/76, pulse 92, height _0  (1.727 m), weight 147 lb (66.7 kg), SpO2 97 %. General: No acute distress.  Patient appears well-groomed.   Head:  Normocephalic/atraumatic Eyes:  Fundi examined but not visualized Neck: supple, no paraspinal tenderness, full range of motion Heart:  Regular rate and rhythm Lungs:  Clear to auscultation bilaterally Back: No paraspinal tenderness Neurological Exam: alert and oriented to person, place, and time.  Speech fluent and not dysarthric, language intact.  CN II-XII intact. Bulk and tone normal, muscle strength 5/5 throughout.  Sensation to light touch intact.  Deep tendon reflexes 2+ throughout, toes downgoing.  Finger to nose testing intact.  Gait normal, Romberg negative.   Metta Clines, DO  CC: Christianne Borrow, MD

## 2021-10-22 ENCOUNTER — Other Ambulatory Visit: Payer: Self-pay

## 2021-10-22 ENCOUNTER — Encounter: Payer: Self-pay | Admitting: Neurology

## 2021-10-22 ENCOUNTER — Ambulatory Visit (INDEPENDENT_AMBULATORY_CARE_PROVIDER_SITE_OTHER): Payer: Medicaid Other | Admitting: Neurology

## 2021-10-22 VITALS — BP 105/76 | HR 92 | Ht 68.0 in | Wt 147.0 lb

## 2021-10-22 DIAGNOSIS — G43109 Migraine with aura, not intractable, without status migrainosus: Secondary | ICD-10-CM | POA: Diagnosis not present

## 2021-10-22 MED ORDER — ONDANSETRON HCL 4 MG PO TABS: 4.0000 mg | ORAL_TABLET | Freq: Three times a day (TID) | ORAL | 5 refills | Status: DC | PRN

## 2021-10-22 MED ORDER — RIZATRIPTAN BENZOATE 10 MG PO TABS: ORAL_TABLET | ORAL | 5 refills | Status: DC

## 2021-10-22 NOTE — Patient Instructions (Signed)
Rizatriptan and ondansetron refilled

## 2021-11-09 DIAGNOSIS — R1012 Left upper quadrant pain: Secondary | ICD-10-CM | POA: Diagnosis not present

## 2021-12-10 ENCOUNTER — Encounter (HOSPITAL_COMMUNITY): Payer: Self-pay | Admitting: Emergency Medicine

## 2021-12-10 ENCOUNTER — Other Ambulatory Visit: Payer: Self-pay

## 2021-12-10 ENCOUNTER — Ambulatory Visit (HOSPITAL_COMMUNITY)
Admission: EM | Admit: 2021-12-10 | Discharge: 2021-12-10 | Disposition: A | Discharge status: Home / Self Care | Payer: Medicaid Other | Attending: Physician Assistant | Admitting: Physician Assistant

## 2021-12-10 DIAGNOSIS — J101 Influenza due to other identified influenza virus with other respiratory manifestations: Secondary | ICD-10-CM | POA: Diagnosis not present

## 2021-12-10 DIAGNOSIS — H1033 Unspecified acute conjunctivitis, bilateral: Secondary | ICD-10-CM | POA: Diagnosis not present

## 2021-12-10 DIAGNOSIS — R051 Acute cough: Secondary | ICD-10-CM

## 2021-12-10 LAB — POC INFLUENZA A AND B ANTIGEN (URGENT CARE ONLY)
INFLUENZA A ANTIGEN, POC: POSITIVE — AB
INFLUENZA B ANTIGEN, POC: NEGATIVE

## 2021-12-10 MED ORDER — POLYMYXIN B-TRIMETHOPRIM 10000-0.1 UNIT/ML-% OP SOLN: 1.0000 [drp] | Freq: Four times a day (QID) | OPHTHALMIC | 0 refills | Status: DC

## 2021-12-10 MED ORDER — OSELTAMIVIR PHOSPHATE 75 MG PO CAPS: 75.0000 mg | ORAL_CAPSULE | Freq: Two times a day (BID) | ORAL | 0 refills | Status: DC

## 2021-12-10 NOTE — Discharge Instructions (Addendum)
You tested positive for the flu.  Please start Tamiflu twice daily.  Use over-the-counter medication for symptom management including Tylenol, ibuprofen, Mucinex, Flonase.  Make sure you rest and drink plenty of fluid.  It is possible that your eye drainage is viral and you will have to let it run its course.  Because you have so much eye drainage we are going to cover for bacteria so please use Polytrim 1 drop in each eye every 6 hours.  Use a warm compress to clean your eyes.  If you have any worsening symptoms you need to be reevaluated including high fever responding to medications, chest pain, shortness of breath, nausea/vomiting interfering with oral intake.

## 2021-12-10 NOTE — ED Provider Notes (Signed)
[facility]    CSN: [id number] Arrival date & time: 12/10/21  0845      History   Chief Complaint Chief Complaint  Patient presents with   Conjunctivitis   Nasal Congestion    HPI Gerald Neal is a 20 y.o. male.   Patient presents today with a 2-day history of URI symptoms.  Reports cough, sore throat, nasal congestion, chills, fatigue, nausea.  Denies any chest pain, shortness of breath, abdominal pain, diarrhea, vomiting.  He denies any known sick contacts.  Has not had COVID or influenza vaccine.  He has not had COVID in the past.  He reports this morning he woke up and his eyes were injected and difficult to open due to drainage; he has not tried any over-the-counter medications for the symptoms.  He does not wear glasses or contacts.  Denies any recent antibiotic use.  He has tried TheraFlu with minimal improvement of symptoms.   Past Medical History:  Diagnosis Date   Deviated septum 08/2018   Recurrent urticaria 01/21/2019   Runny nose 09/18/2018   clear drainage, per mother   Sore throat 09/18/2018    Patient Active Problem List   Diagnosis Date Noted   Recurrent urticaria 01/21/2019   Chronic rhinitis 01/21/2019   History of penicillin allergy 01/21/2019   Allergic reaction 01/21/2019   Partial bowel obstruction (HCC) 11/11/2016    Past Surgical History:  Procedure Laterality Date   APPENDECTOMY  10/31/2003   CENTRAL LINE INSERTION Right 10/31/2003   INGUINAL HERNIA REPAIR Left 03/30/2004   LAPAROSCOPY N/A 11/11/2016   Procedure: LAPAROSCOPY DIAGNOSTIC  AND EXPLORATORY LYSIS OF ADHESION;  Surgeon: Leonia Corona, MD;  Location: [facility];  Service: General;  Laterality: N/A;   SEPTOPLASTY  03/2018   SEPTOPLASTY N/A 09/25/2018   Procedure: REVISION SEPTOPLASTY;  Surgeon: Newman Pies, MD;  Location: [facility];  Service: ENT;  Laterality: N/A;       Home Medications    Prior to Admission medications   Medication Sig  Start Date End Date Taking? Authorizing Provider  oseltamivir (TAMIFLU) 75 MG capsule Take 1 capsule (75 mg total) by mouth every 12 (twelve) hours. 12/10/21  Yes Cedric Mcclaine K, PA-C  trimethoprim-polymyxin b (POLYTRIM) ophthalmic solution Place 1 drop into both eyes every 6 (six) hours. 12/10/21  Yes Modest Draeger K, PA-C  desonide (DESOWEN) 0.05 % cream Apply topically 2 (two) times daily. Patient not taking: No sig reported 04/06/21   Alfonse Spruce, MD  ondansetron (ZOFRAN) 4 MG tablet Take 1 tablet (4 mg total) by mouth every 8 (eight) hours as needed for nausea or vomiting. 10/22/21   Jaffe, Adam R, DO  rizatriptan (MAXALT) 10 MG tablet TAKE 1 TABLET BY MOUTH AS NEEDED FOR MIGRAINE. MAY REPEAT IN 2 HOURS IF NEEDED 10/22/21   Drema Dallas, DO  Sodium Fluoride 0.243 % PSTE Use as directed in the mouth or throat. Patient not taking: No sig reported    [provider]    Family History Family History  Problem Relation Age of Onset   Diabetes Maternal Grandfather    Diabetes Paternal Grandfather    Healthy Mother    Healthy Father    Healthy Sister    Allergic rhinitis Neg Hx    Asthma Neg Hx    Eczema Neg Hx    Urticaria Neg Hx     Social History Social History   Tobacco Use   Smoking status: Never   Smokeless tobacco:  Never  Vaping Use   Vaping Use: Never used  Substance Use Topics   Alcohol use: No   Drug use: No     Allergies   Patient has no known allergies.   Review of Systems Review of Systems  Constitutional:  Positive for activity change, appetite change, chills and fatigue. Negative for fever.  HENT:  Positive for congestion and sore throat. Negative for sinus pressure and sneezing.   Eyes:  Positive for discharge and redness. Negative for pain, itching and visual disturbance.  Respiratory:  Positive for cough. Negative for shortness of breath.   Cardiovascular:  Negative for chest pain.  Gastrointestinal:  Positive for nausea. Negative for  abdominal pain, diarrhea and vomiting.  Musculoskeletal:  Negative for arthralgias and myalgias.  Neurological:  Positive for headaches. Negative for dizziness and light-headedness.    Physical Exam Triage Vital Signs ED Triage Vitals  Enc Vitals Group     BP 12/10/21 0923 111/68     Pulse Rate 12/10/21 0923 71     Resp 12/10/21 0923 14     Temp 12/10/21 0923 98.6 F (37 C)     Temp Source 12/10/21 0923 Oral     SpO2 12/10/21 0923 98 %     Weight --      Height --      Head Circumference --      Peak Flow --      Pain Score 12/10/21 0922 0     Pain Loc --      Pain Edu? --      Excl. in [place]? --    No data found.  Updated Vital Signs BP 111/68 (BP Location: Left Arm)    Pulse 71    Temp 98.6 F (37 C) (Oral)    Resp 14    SpO2 98%   Visual Acuity Right Eye Distance:   Left Eye Distance:   Bilateral Distance:    Right Eye Near:   Left Eye Near:    Bilateral Near:     Physical Exam Vitals reviewed.  Constitutional:      General: He is awake.     Appearance: Normal appearance. He is well-developed. He is not ill-appearing.     Comments: Very pleasant male appears stated age in no acute distress sitting comfortably in exam room  HENT:     Head: Normocephalic and atraumatic.     Right Ear: Tympanic membrane, ear canal and external ear normal. Tympanic membrane is not erythematous or bulging.     Left Ear: Tympanic membrane, ear canal and external ear normal. Tympanic membrane is not erythematous or bulging.     Nose: Nose normal.     Mouth/Throat:     Pharynx: Uvula midline. Posterior oropharyngeal erythema present. No oropharyngeal exudate or uvula swelling.  Eyes:     Conjunctiva/sclera:     Right eye: Right conjunctiva is injected.     Left eye: Left conjunctiva is injected.     Pupils: Pupils are equal, round, and reactive to light.  Cardiovascular:     Rate and Rhythm: Normal rate and regular rhythm.     Heart sounds: Normal heart sounds, S1 normal and S2  normal. No murmur heard. Pulmonary:     Effort: Pulmonary effort is normal. No accessory muscle usage or respiratory distress.     Breath sounds: Normal breath sounds. No stridor. No wheezing, rhonchi or rales.     Comments: Clear to auscultation bilaterally Abdominal:     General:  Bowel sounds are normal.     Palpations: Abdomen is soft.     Tenderness: There is no abdominal tenderness.  Neurological:     Mental Status: He is alert.  Psychiatric:        Behavior: Behavior is cooperative.     UC Treatments / Results  Labs (all labs ordered are listed, but only abnormal results are displayed) Labs Reviewed  POC INFLUENZA A AND B ANTIGEN (URGENT CARE ONLY) - Abnormal; Notable for the following components:      Result Value   INFLUENZA A ANTIGEN, POC POSITIVE (*)    All other components within normal limits    EKG   Radiology No results found.  Procedures Procedures (including critical care time)  Medications Ordered in UC Medications - No data to display  Initial Impression / Assessment and Plan / UC Course  I have reviewed the triage vital signs and the nursing notes.  Pertinent labs & imaging results that were available during my care of the patient were reviewed by me and considered in my medical decision making (see chart for details).     Discussed likely viral etiology given short duration of symptoms.  Patient tested positive for influenza A.  Since he is within 72 hours of symptom onset we will start Tamiflu twice daily.  Recommended over-the-counter medications for symptom relief including Tylenol, Mucinex, Flonase.  Discussed that conjunctivitis could be viral in nature but given significant drainage overnight that prevented him from opening his eyes this morning we will cover for bacterial etiology with Polytrim.  He is to rest and drink plenty of fluid.  Discussed alarm symptoms that warrant emergent evaluation.  Strict return precautions given to which he  expressed understanding.  Final Clinical Impressions(s) / UC Diagnoses   Final diagnoses:  Influenza A  Acute cough  Acute conjunctivitis of both eyes, unspecified acute conjunctivitis type     Discharge Instructions      You tested positive for the flu.  Please start Tamiflu twice daily.  Use over-the-counter medication for symptom management including Tylenol, ibuprofen, Mucinex, Flonase.  Make sure you rest and drink plenty of fluid.  It is possible that your eye drainage is viral and you will have to let it run its course.  Because you have so much eye drainage we are going to cover for bacteria so please use Polytrim 1 drop in each eye every 6 hours.  Use a warm compress to clean your eyes.  If you have any worsening symptoms you need to be reevaluated including high fever responding to medications, chest pain, shortness of breath, nausea/vomiting interfering with oral intake.     ED Prescriptions     Medication Sig Dispense Auth. Provider   trimethoprim-polymyxin b (POLYTRIM) ophthalmic solution Place 1 drop into both eyes every 6 (six) hours. 10 mL Deeya Richeson K, PA-C   oseltamivir (TAMIFLU) 75 MG capsule Take 1 capsule (75 mg total) by mouth every 12 (twelve) hours. 10 capsule Zakaiya Lares, Derry Skill, PA-C      PDMP not reviewed this encounter.   Terrilee Croak, PA-C 12/10/21 1048

## 2021-12-10 NOTE — ED Triage Notes (Signed)
Pt reports that had drainage in eyes yesterday and told woke up with pink eye in both eyes. Had cold couple days ago with congestion.

## 2022-05-04 DIAGNOSIS — Z114 Encounter for screening for human immunodeficiency virus [HIV]: Secondary | ICD-10-CM | POA: Diagnosis not present

## 2022-05-04 DIAGNOSIS — Z113 Encounter for screening for infections with a predominantly sexual mode of transmission: Secondary | ICD-10-CM | POA: Diagnosis not present

## 2022-10-19 NOTE — Progress Notes (Deleted)
NEUROLOGY FOLLOW UP OFFICE NOTE  Gerald Neal [id number]  Assessment/Plan:   Migraine with aura, without status migrainosus, not intractable   Migraine prevention:  Deferred as headaches are infrequent Migraine rescue:  ibuprofen or rizatriptan $RemoveBefore'10mg'OutNTPLqQwfdp$ , Zofran $Remove'4mg'PolVQSl$  Limit use of pain relievers to no more than 2 days out of week to prevent risk of rebound or medication-overuse headache. Keep headache diary Follow up one year   Subjective:  Gerald Neal is an 21 year old male follows up for migraines.  He is accompanied by his mother who supplements history.   UPDATE 1 migraine in last 30 days.  It quickly aborted with rizatriptan or ibuprofen.  he had one headache with right eye pain.  Frequency of abortive medication: none Current NSAIDS:  ibuprofen Current analgesics:  none Current triptans:  rizatriptan $RemoveBefo'10mg'TvJBYBbplul$  Current ergotamine:  none Current anti-emetic:  Zofran $Remov'4mg'OguINX$  Current muscle relaxants:  none Current anti-anxiolytic:  none Current sleep aide:  none Current Antihypertensive medications:  none Current Antidepressant medications:  none Current Anticonvulsant medications:  none Current anti-CGRP:  none Current Vitamins/Herbal/Supplements:  none Current Antihistamines/Decongestants:   none Other therapy:  none Hormone/birth control:  none Other medications:  none   HISTORY: Onset:  He had migraines as a child up until 8 years old.  Those headaches were bitemporal and associated with nausea and vomiting.  He has had a recurrence of headaches at age 55.  They are different.  He loses unilateral eye sight (vision black) for 30 to 45 minutes, followed by gradual onset of left sided throbbing headache that then becomes diffuse and 8/10 intensity.  They typically last 3 hours.  There is no associated nausea, vomiting, photophobia, phonophobia, or unilateral numbness or weakness.  Physical exertion, eye strain and blue light filter may be a trigger.  The  first couple of times, it occurred while working in an [employer] and another time it occurred while working out.  Nothing really helps.  He has had maybe up to 5 over the past 2 years.   He presented to the ED on 09/10/2019 because it would not abort.  He was afebrile and did not have abnormal neurologic exam.  Alk phos and t bili were mildly elevated at 142 and 1.3 respectively, but otherwise CBC, CMP, and UA were unremarkable.   In May 2021, he developed another intractable migraine.  It was severe left temporal pain lasting 2 to 3 days.  His mother said that his left eye looked "larger".  He tried sumatriptan once which was ineffective.  He vomited, which did not help.  He received a headache cocktail in our office on Friday, which helped.  MRI of brain with and without contrast was performed on 05/27/2020, which was personally reviewed and was normal  For several years, he has had episodes of numbness over his lip.  They occur infrequently.  Unsure if they correlate with migraine.   Past NSAIDS:  ibuprofen Past analgesics:  Tylenol Past abortive triptans:  sumatriptan $RemoveBefo'100mg'LPLpnTKaEUz$  Past abortive ergotamine:  none Past muscle relaxants:  none Past anti-emetic:  Zofran Past antihypertensive medications:  none Past antidepressant medications:  none Past anticonvulsant medications:  topiramate (no problems) Past anti-CGRP:  none Past vitamins/Herbal/Supplements:  none Past antihistamines/decongestants:  none Other past therapies:  none   Family history of headache:  No  PAST MEDICAL HISTORY: Past Medical History:  Diagnosis Date   Deviated septum 08/2018   Recurrent urticaria 01/21/2019   Runny nose 09/18/2018  clear drainage, per mother   Sore throat 09/18/2018    MEDICATIONS: Current Outpatient Medications on File Prior to Visit  Medication Sig Dispense Refill   desonide (DESOWEN) 0.05 % cream Apply topically 2 (two) times daily. (Patient not taking: No sig reported) 30 g 0    ondansetron (ZOFRAN) 4 MG tablet Take 1 tablet (4 mg total) by mouth every 8 (eight) hours as needed for nausea or vomiting. 20 tablet 5   oseltamivir (TAMIFLU) 75 MG capsule Take 1 capsule (75 mg total) by mouth every 12 (twelve) hours. 10 capsule 0   rizatriptan (MAXALT) 10 MG tablet TAKE 1 TABLET BY MOUTH AS NEEDED FOR MIGRAINE. MAY REPEAT IN 2 HOURS IF NEEDED 10 tablet 5   Sodium Fluoride 0.243 % PSTE Use as directed in the mouth or throat. (Patient not taking: No sig reported)     trimethoprim-polymyxin b (POLYTRIM) ophthalmic solution Place 1 drop into both eyes every 6 (six) hours. 10 mL 0   No current facility-administered medications on file prior to visit.    ALLERGIES: No Known Allergies  FAMILY HISTORY: Family History  Problem Relation Age of Onset   Diabetes Maternal Grandfather    Diabetes Paternal Grandfather    Healthy Mother    Healthy Father    Healthy Sister    Allergic rhinitis Neg Hx    Asthma Neg Hx    Eczema Neg Hx    Urticaria Neg Hx       Objective:  *** General: No acute distress.  Patient appears well-groomed.   Head:  Normocephalic/atraumatic Eyes:  Fundi examined but not visualized Neck: supple, no paraspinal tenderness, full range of motion Heart:  Regular rate and rhythm Neurological Exam: alert and oriented to person, place, and time.  Speech fluent and not dysarthric, language intact.  CN II-XII intact. Bulk and tone normal, muscle strength 5/5 throughout.  Sensation to light touch intact.  Deep tendon reflexes 2+ throughout.  Finger to nose testing intact.  Gait normal, Romberg negative.   Metta Clines, DO  CC: Christianne Borrow, MD

## 2022-10-24 ENCOUNTER — Ambulatory Visit: Payer: Medicaid Other | Admitting: Neurology

## 2023-01-05 DIAGNOSIS — Z23 Encounter for immunization: Secondary | ICD-10-CM | POA: Diagnosis not present

## 2023-01-05 DIAGNOSIS — M25531 Pain in right wrist: Secondary | ICD-10-CM | POA: Diagnosis not present

## 2023-01-05 DIAGNOSIS — M25572 Pain in left ankle and joints of left foot: Secondary | ICD-10-CM | POA: Diagnosis not present

## 2023-01-11 ENCOUNTER — Ambulatory Visit (INDEPENDENT_AMBULATORY_CARE_PROVIDER_SITE_OTHER): Payer: Medicaid Other

## 2023-01-11 ENCOUNTER — Ambulatory Visit (INDEPENDENT_AMBULATORY_CARE_PROVIDER_SITE_OTHER): Payer: Medicaid Other | Admitting: Physician Assistant

## 2023-01-11 ENCOUNTER — Encounter: Payer: Self-pay | Admitting: Physician Assistant

## 2023-01-11 DIAGNOSIS — M654 Radial styloid tenosynovitis [de Quervain]: Secondary | ICD-10-CM

## 2023-01-11 DIAGNOSIS — M25572 Pain in left ankle and joints of left foot: Secondary | ICD-10-CM

## 2023-01-11 DIAGNOSIS — S96812A Strain of other specified muscles and tendons at ankle and foot level, left foot, initial encounter: Secondary | ICD-10-CM

## 2023-01-11 DIAGNOSIS — M25531 Pain in right wrist: Secondary | ICD-10-CM

## 2023-01-11 HISTORY — DX: Strain of other specified muscles and tendons at ankle and foot level, left foot, initial encounter: S96.812A

## 2023-01-11 NOTE — Progress Notes (Signed)
Office Visit Note   Patient: Gerald Neal           Date of Birth: [birth year]           MRN: [id number] Visit Date: 01/11/2023              Requested by: Donato Schultz, FNP [address] [place],  [place] [address] PCP: Donato Schultz, FNP  Chief Complaint  Patient presents with   Left Ankle - Pain   Right Wrist - Pain      HPI: Gerald Neal is a pleasant active 22 year old gentleman with a chief complaint of right radial wrist pain x 2 months and left posterior ankle pain x 2 about the same amount.  He has a history of a previous fracture in his wrist.  This was a few years ago and healed without difficulty.  He said recently he was wrestling with his girlfriend and extended his wrist.  After that he began to have pain that began at the base of his thumb and went down into his wrist.  At the time his girlfriend thought he had a little bit of swelling but no ecchymosis.  He says it does not feel like a fracture but like a tendon.  He also is complaining of left posterior ankle pain.  He is very active enjoys working out.  He admits that sometimes he lapses in working out and then he does too hard too soon.  He said he was doing an exercise up on his toes when he felt a pop in the back of his left ankle.  He did not have any profound weakness but still has pain and a popping.  He is afraid to advance his exercise program as he does not want to do any more damage  Assessment & Plan: Visit Diagnoses:  1. Pain in right wrist   2. Left ankle pain, unspecified chronicity   3. Rupture of left plantaris tendon, initial encounter     Plan: Exam for the left ankle most of his discomfort is in the left posterior ankle.  His Achilles is intact he has a negative Thompson's test.  I do think perhaps he has rupture of the plantaris.  He does have fluid in the back of the heel on x-ray.  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  Discussed all this with him.  Also discussed him with Dr. Shon Baton.  I think it would be appropriate to refer him to Dr. Shon Baton for an ultrasound evaluation of his posterior ankle to confirm a diagnosis.  May be a candidate for also Tron treatment.  Also with regards to his wrist at the same time could get a injection.  Patient is in agreement with this plan will contact him  Follow-Up Instructions: Return With Dr. Shon Baton.   Ortho Exam  Patient is alert, oriented, no adenopathy, well-dressed, normal affect, normal respiratory effort. Right wrist.  No erythema no redness no bruising.  Has a positive Finkelstein's test.  No tenderness over the hand or over the remainder of the wrist.  Negative Tinel's sign.  Sensation is intact brisk capillary refill. Left ankle no ecchymosis erythema or swelling.  He is neurovascularly intact.  No tenderness over the plantar surface of his foot.  He has a negative Thompson's test.  Cannot reproduce the popping today that he gets that relieves his pain.  Compartments  of the posterior leg are soft nontender negative Homans' sign  Imaging: XR Wrist 2 Views Right  Result Date: 01/11/2023 2 views of his right wrist demonstrate well-maintained alignment no acute fractures noted no degenerative changes  XR Ankle 2 Views Left  Result Date: 01/11/2023 2 view radiographs of his left ankle reviewed today.  Well-maintained alignment no evidence of any fracture.  On lateral he does have fluid in the posterior leg that goes down to the calcaneus.  No images are attached to the encounter.  Labs: Lab Results  Component Value Date   ESRSEDRATE 5 01/21/2019     Lab Results  Component Value Date   ALBUMIN 4.2 09/10/2019   ALBUMIN 5.0 01/21/2019   ALBUMIN 4.4 11/11/2016    No results found for: "MG" No results found for: "VD25OH"  No results found for: "PREALBUMIN"    Latest Ref Rng & Units 09/10/2019   10:59 PM 01/21/2019    3:17 PM  11/11/2016    5:56 AM  CBC EXTENDED  WBC 4.0 - 10.5 K/uL 7.8  6.3  9.8   RBC 4.22 - 5.81 MIL/uL 5.00  5.09  5.41   Hemoglobin 13.0 - 17.0 g/dL 16.4  16.3  16.5   HCT 39.0 - 52.0 % 46.5  46.2  47.8   Platelets 150 - 400 K/uL 208  285  232   NEUT# 1.4 - 7.0 x10E3/uL  3.5  8.6   Lymph# 0.7 - 3.1 x10E3/uL  2.3  1.0      There is no height or weight on file to calculate BMI.  Orders:  Orders Placed This Encounter  Procedures   XR Wrist 2 Views Right   XR Ankle 2 Views Left   Ambulatory referral to Orthopedic Surgery   No orders of the defined types were placed in this encounter.    Procedures: No procedures performed  Clinical Data: No additional findings.  ROS:  All other systems negative, except as noted in the HPI. Review of Systems  Objective: Vital Signs: There were no vitals taken for this visit.  Specialty Comments:  No specialty comments available.  PMFS History: Patient Active Problem List   Diagnosis Date Noted   Rupture of left plantaris tendon 01/11/2023   Recurrent urticaria 01/21/2019   Chronic rhinitis 01/21/2019   History of penicillin allergy 01/21/2019   Allergic reaction 01/21/2019   Partial bowel obstruction ([place]) 11/11/2016   Past Medical History:  Diagnosis Date   Deviated septum 08/2018   Recurrent urticaria 01/21/2019   Runny nose 09/18/2018   clear drainage, per mother   Sore throat 09/18/2018    Family History  Problem Relation Age of Onset   Diabetes Maternal Grandfather    Diabetes Paternal Grandfather    Healthy Mother    Healthy Father    Healthy Sister    Allergic rhinitis Neg Hx    Asthma Neg Hx    Eczema Neg Hx    Urticaria Neg Hx     Past Surgical History:  Procedure Laterality Date   APPENDECTOMY  10/31/2003   CENTRAL LINE INSERTION Right 10/31/2003   INGUINAL HERNIA REPAIR Left 03/30/2004   LAPAROSCOPY N/A 11/11/2016   Procedure: LAPAROSCOPY DIAGNOSTIC  AND EXPLORATORY LYSIS OF ADHESION;  Surgeon: Gerald Stabs, MD;  Location: [facility];  Service: General;  Laterality: N/A;   SEPTOPLASTY  03/2018   SEPTOPLASTY N/A 09/25/2018   Procedure: REVISION SEPTOPLASTY;  Surgeon: Leta Baptist, MD;  Location: [facility];  Service: ENT;  Laterality: N/A;   Social History   Occupational History   Not on file  Tobacco Use   Smoking status: Never   Smokeless tobacco: Never  Vaping Use   Vaping Use: Never used  Substance and Sexual Activity   Alcohol use: No   Drug use: No   Sexual activity: Never

## 2023-01-16 ENCOUNTER — Ambulatory Visit (INDEPENDENT_AMBULATORY_CARE_PROVIDER_SITE_OTHER): Payer: Medicaid Other | Admitting: Sports Medicine

## 2023-01-16 ENCOUNTER — Ambulatory Visit: Payer: Self-pay

## 2023-01-16 ENCOUNTER — Encounter: Payer: Self-pay | Admitting: Sports Medicine

## 2023-01-16 DIAGNOSIS — M25572 Pain in left ankle and joints of left foot: Secondary | ICD-10-CM | POA: Diagnosis not present

## 2023-01-16 DIAGNOSIS — S96812D Strain of other specified muscles and tendons at ankle and foot level, left foot, subsequent encounter: Secondary | ICD-10-CM | POA: Diagnosis not present

## 2023-01-16 DIAGNOSIS — M654 Radial styloid tenosynovitis [de Quervain]: Secondary | ICD-10-CM

## 2023-01-16 MED ORDER — METHYLPREDNISOLONE 4 MG PO TBPK: ORAL_TABLET | ORAL | 0 refills | Status: DC

## 2023-01-16 NOTE — Progress Notes (Signed)
Gerald Neal - 22 y.o. male MRN [id number]  Date of birth: 2001-05-19  Office Visit Note: Visit Date: 01/16/2023 PCP: Demetrios Isaacs, FNP Referred by: Persons, Bevely Palmer, PA  Subjective: Chief Complaint  Patient presents with   Right Wrist - Pain   Left Ankle - Pain   HPI: Gerald Neal is a pleasant 22 y.o. male who presents today for evaluation of wrist pain and posterior calf/ankle pain.   He has had right radial sided wrist pain for at least 2 months.  No specific injury, feels like this may have been exacerbated when he was wrestling around with his girlfriend and extended the wrist.  He is not taking any medication or bracing for this.  It has started slightly better.  There is radiation to the proximal aspect of the thumb.  No redness or swelling.  Left posterior ankle pain.  This started when he was working out and went up onto the balls of his feet with a pop in the back of the ankle.  He does feel like the calf is slightly weaker now. Occasionally he will feel a pop/crack around the posterior ankle/heel but is not as painful as before.  He is going to get back into working out and feels like he can, although is somewhat hesitant.  Pertinent ROS were reviewed with the patient and found to be negative unless otherwise specified above in HPI.   Assessment & Plan: Visit Diagnoses:  1. Left ankle pain, unspecified chronicity   2. Rupture of left plantaris tendon, subsequent encounter   3. De Quervain's tenosynovitis    Plan: Discussed with Linh for the likely etiologies for his wrist and lower leg/ankle pain.  His wrist pain is most indicative of de Quervain's tenosynovitis, this has been improving some with just activity modification and rest.  Discussed bracing, oral medication, injection therapy.  We will send in a 4 mg methylprednisolone taper for 6 days to help with inflammation.  This appropriate activity modification and work at [employer].  We did  ultrasound his lower leg, which shows findings of possible plantaris tearing.  Very reassuring his medial gastroc and Achilles tendon was intact without any evidence of tearing.  He did have some hypoechoic fluid change in the this area which could be a bloody versus swelling.  We did after shared decision making decided to do a trial of extracorporeal shockwave therapy.  I do think it is pertinent to get him started on a rehab program for the calf and ankle.  My athletic trainer, Lilia Pro, did review his custom printed out home rehab exercise handout and demonstrate these for him today. He is to perform once daily.  Would expect both of these conditions to improve in the short-term, if they do not he may follow-up.   Follow-up: Return if symptoms worsen or fail to improve.   Meds & Orders:  Meds ordered this encounter  Medications   methylPREDNISolone (MEDROL DOSEPAK) 4 MG TBPK tablet    Sig: Take per packet instructions. (Taper: 6 tabs on day 1, 5 tabs on day 2,....)    Dispense:  1 each    Refill:  0    Orders Placed This Encounter  Procedures   [place] Extrem Low Left Ltd     Procedures: Procedure: ECSWT Indications:  posterior ankle/plantaris injury   Procedure Details Consent: Risks of procedure as well as the alternatives and risks of each were explained to the patient.  Verbal consent for procedure  obtained. Time Out: Verified patient identification, verified procedure, site was marked, verified correct patient position. The area was cleaned with alcohol swab.     The left gastroc MT junction and plantaris/achilles junction was targeted for Extracorporeal shockwave therapy.    Preset: status post muscular injury Power Level: 90 - 100 mJ Frequency: 10-11 Hz Impulse/cycles: 2500 Head size: Regular   Patient tolerated procedure well without immediate complications.       Clinical History: No specialty comments available.  He reports that he has never smoked. He has never used  smokeless tobacco. No results for input(s): "HGBA1C", "LABURIC" in the last 8760 hours.  Objective:    Physical Exam  Gen: Well-appearing, in no acute distress; non-toxic CV: Regular Rate. Well-perfused. Warm.  Resp: Breathing unlabored on room air; no wheezing. Psych: Fluid speech in conversation; appropriate affect; normal thought process Neuro: Sensation intact throughout. No gross coordination deficits.   Ortho Exam - Right wrist: There is some generalized TTP over the radial aspect of the wrist near the first and second dorsal compartment.  No scaphoid tubercle TTP, negative CMC grind test.  Full range of motion about the thumb and wrist.  Positive Finkelstein's test.   - Left ankle/LE: There is no soft tissue swelling or redness.  No bony TTP.  There is some mild TTP more so in the very medial aspect of the myotendinous junction of the Achilles and posterior calf musculature.  Able to perform bilateral heel raise, although left is weaker than the right.  No ankle instability with ligamentous testing. NVI.  Imaging: [place] Extrem Low Left Ltd  Result Date: 01/16/2023 Limited musculoskeletal ultrasound of the left lower extremity was performed.  Evaluation of the calcaneus was noted in short and long axis without cortical regularity.  The Achilles tendon was viewed in short and long axis without evidence of tearing and proper distal insertion on the calcaneus.  There is a mild degree of hyperemia in the retrocalcaneal bursa.  No thickness of the Achilles tendon at 0.50 cm in diameter.  Scanning proximally up to There is no tearing of the medial head of the gastroc at the myotendinous junction.  Just distal to this near the transition where a plantaris tendon may lie, there is some hypoechoic fluid change and notable hyperemia, suggestive of possible plantaris tendon tear.      2 view radiographs of his left ankle reviewed today.  Well-maintained  alignment no evidence of any fracture.  On  lateral he does have fluid in  the posterior leg that goes down to the calcaneus.   Past Medical/Family/Surgical/Social History: Medications & Allergies reviewed per EMR, new medications updated. Patient Active Problem List   Diagnosis Date Noted   Rupture of left plantaris tendon 01/11/2023   De Quervain's tenosynovitis 01/11/2023   Recurrent urticaria 01/21/2019   Chronic rhinitis 01/21/2019   History of penicillin allergy 01/21/2019   Allergic reaction 01/21/2019   Partial bowel obstruction ([place]) 11/11/2016   Past Medical History:  Diagnosis Date   Deviated septum 08/2018   Recurrent urticaria 01/21/2019   Runny nose 09/18/2018   clear drainage, per mother   Sore throat 09/18/2018   Family History  Problem Relation Age of Onset   Diabetes Maternal Grandfather    Diabetes Paternal Grandfather    Healthy Mother    Healthy Father    Healthy Sister    Allergic rhinitis Neg Hx    Asthma Neg Hx    Eczema Neg Hx    Urticaria  Neg Hx    Past Surgical History:  Procedure Laterality Date   APPENDECTOMY  10/31/2003   CENTRAL LINE INSERTION Right 10/31/2003   INGUINAL HERNIA REPAIR Left 03/30/2004   LAPAROSCOPY N/A 11/11/2016   Procedure: LAPAROSCOPY DIAGNOSTIC  AND EXPLORATORY LYSIS OF ADHESION;  Surgeon: Leonia Corona, MD;  Location: [facility];  Service: General;  Laterality: N/A;   SEPTOPLASTY  03/2018   SEPTOPLASTY N/A 09/25/2018   Procedure: REVISION SEPTOPLASTY;  Surgeon: Newman Pies, MD;  Location: [facility];  Service: ENT;  Laterality: N/A;   Social History   Occupational History   Not on file  Tobacco Use   Smoking status: Never   Smokeless tobacco: Never  Vaping Use   Vaping Use: Never used  Substance and Sexual Activity   Alcohol use: No   Drug use: No   Sexual activity: Never

## 2023-01-16 NOTE — Progress Notes (Signed)
Right wrist and left ankle pain Pain has been present since around November  No injury recently to either  History of wrist fracture in 2020

## 2023-01-19 DIAGNOSIS — Z0000 Encounter for general adult medical examination without abnormal findings: Secondary | ICD-10-CM | POA: Diagnosis not present

## 2023-01-19 DIAGNOSIS — Z130 Encounter for screening for diseases of the blood and blood-forming organs and certain disorders involving the immune mechanism: Secondary | ICD-10-CM | POA: Diagnosis not present

## 2023-01-19 DIAGNOSIS — Z113 Encounter for screening for infections with a predominantly sexual mode of transmission: Secondary | ICD-10-CM | POA: Diagnosis not present

## 2023-01-19 DIAGNOSIS — Z1159 Encounter for screening for other viral diseases: Secondary | ICD-10-CM | POA: Diagnosis not present

## 2023-01-19 DIAGNOSIS — K219 Gastro-esophageal reflux disease without esophagitis: Secondary | ICD-10-CM | POA: Diagnosis not present

## 2023-01-19 DIAGNOSIS — H538 Other visual disturbances: Secondary | ICD-10-CM | POA: Diagnosis not present

## 2023-01-19 DIAGNOSIS — Z13220 Encounter for screening for lipoid disorders: Secondary | ICD-10-CM | POA: Diagnosis not present

## 2023-01-19 DIAGNOSIS — Z131 Encounter for screening for diabetes mellitus: Secondary | ICD-10-CM | POA: Diagnosis not present

## 2023-01-19 DIAGNOSIS — Z1329 Encounter for screening for other suspected endocrine disorder: Secondary | ICD-10-CM | POA: Diagnosis not present

## 2023-05-18 DIAGNOSIS — F459 Somatoform disorder, unspecified: Secondary | ICD-10-CM | POA: Diagnosis not present

## 2023-05-18 DIAGNOSIS — R1013 Epigastric pain: Secondary | ICD-10-CM | POA: Diagnosis not present

## 2023-05-18 DIAGNOSIS — N369 Urethral disorder, unspecified: Secondary | ICD-10-CM | POA: Diagnosis not present

## 2023-05-18 DIAGNOSIS — M25572 Pain in left ankle and joints of left foot: Secondary | ICD-10-CM | POA: Diagnosis not present

## 2023-05-18 DIAGNOSIS — F411 Generalized anxiety disorder: Secondary | ICD-10-CM | POA: Diagnosis not present

## 2023-05-18 DIAGNOSIS — N63 Unspecified lump in unspecified breast: Secondary | ICD-10-CM | POA: Diagnosis not present

## 2023-05-19 DIAGNOSIS — M25572 Pain in left ankle and joints of left foot: Secondary | ICD-10-CM | POA: Insufficient documentation

## 2023-05-19 DIAGNOSIS — N63 Unspecified lump in unspecified breast: Secondary | ICD-10-CM | POA: Insufficient documentation

## 2023-05-19 DIAGNOSIS — N369 Urethral disorder, unspecified: Secondary | ICD-10-CM | POA: Insufficient documentation

## 2023-05-19 DIAGNOSIS — F411 Generalized anxiety disorder: Secondary | ICD-10-CM | POA: Insufficient documentation

## 2023-05-26 ENCOUNTER — Encounter: Payer: Self-pay | Admitting: Sports Medicine

## 2023-05-26 ENCOUNTER — Ambulatory Visit (INDEPENDENT_AMBULATORY_CARE_PROVIDER_SITE_OTHER): Payer: Medicaid Other | Admitting: Sports Medicine

## 2023-05-26 DIAGNOSIS — M2141 Flat foot [pes planus] (acquired), right foot: Secondary | ICD-10-CM

## 2023-05-26 DIAGNOSIS — M62562 Muscle wasting and atrophy, not elsewhere classified, left lower leg: Secondary | ICD-10-CM | POA: Diagnosis not present

## 2023-05-26 DIAGNOSIS — G8929 Other chronic pain: Secondary | ICD-10-CM

## 2023-05-26 DIAGNOSIS — M2142 Flat foot [pes planus] (acquired), left foot: Secondary | ICD-10-CM | POA: Diagnosis not present

## 2023-05-26 DIAGNOSIS — M25572 Pain in left ankle and joints of left foot: Secondary | ICD-10-CM | POA: Diagnosis not present

## 2023-05-26 NOTE — Progress Notes (Signed)
He was doing good since last visit. Doing HEP as shown As of two days ago, he tweaked it and felt another pop Pain moved further down his ankle to his heel/achilles  Complains that the ankle "pops" all the time  But does feel like it is getting stronger

## 2023-05-26 NOTE — Progress Notes (Signed)
Gerald Neal - 22 y.o. male MRN [id number]  Date of birth: 05-25-2001  Office Visit Note: Visit Date: 05/26/2023 PCP: Donato Schultz, FNP Referred by: Donato Schultz, FNP  Subjective: Chief Complaint  Patient presents with   Left Ankle - Follow-up   HPI: Gerald Neal is a pleasant 22 y.o. male who presents today for evaluation of left posterior ankle pain.  Gerald Neal saw me back in January after having a popping pain over the posterior ankle that extended up to the distal calf.  We did ultrasound at that time and showed some hypoechoic fluid change within the distal calf and proximal Achilles region without evidence of tearing.  Question whether this was a plantaris tendon rupture.  We did give him home exercises which she has been doing religiously, feels like the Was getting much stronger but still feels like the musculature is slightly weaker on the left compared to his right.  Unfortunately he has continued having some clicking/popping sensation in a few days ago he reaggravated this.  Not taking medication for this.  He is very active with weightlifting.  Does note he has been told he has flatfeet in the past, he does have orthotics/inserts that he wears at times.  Pertinent ROS were reviewed with the patient and found to be negative unless otherwise specified above in HPI.   Assessment & Plan: Visit Diagnoses:  1. Chronic pain of left ankle   2. Left calf atrophy   3. Pes planus of both feet    Plan: [name] has had an exacerbation of his chronic posterior left ankle pain -he has been making improvements with strength and function from his home rehab, but still getting clicking and popping in the posterior aspect of the ankle.  Previous ultrasound did show some concern for possible plantaris tear as he did have hypoechoic fluid in this region surrounding the calf musculature.  At this point given the chronicity and his reaggravation, I think it is pertinent to  obtain MRI of the ankle.  Did review his prior x-rays today which show no bony abnormality.  He will continue with his home exercises for the calf and Achilles for strengthening.  Did add towel scrunches to help support his arch and the plantar fascia today.  May use over-the-counter anti-inflammatories as needed.  Did discuss the role for extracorporeal shockwave therapy to help break up any scar tissue or underlying tendon damage, may consider this in the future but will obtain MRI first to better evaluate pathology.  Follow-up: Return for f/u 5 days after MRI of ankle.   Meds & Orders: No orders of the defined types were placed in this encounter.   Orders Placed This Encounter  Procedures   MR Ankle Left w/o contrast     Procedures: No procedures performed      Clinical History: No specialty comments available.  He reports that he has never smoked. He has never used smokeless tobacco. No results for input(s): "HGBA1C", "LABURIC" in the last 8760 hours.  Objective:   Vital Signs: There were no vitals taken for this visit.  Physical Exam  Gen: Well-appearing, in no acute distress; non-toxic CV: Well-perfused. Warm.  Resp: Breathing unlabored on room air; no wheezing. Psych: Fluid speech in conversation; appropriate affect; normal thought process Neuro: Sensation intact throughout. No gross coordination deficits.   Ortho Exam - Left ankle/leg: Evaluation of the left leg does show a very mild degree of less muscle bulk of the calf musculature  on the left compared to the right.  There is no specific tenderness to palpation.  Patient is able to perform bilateral heel raise, posterior tibial tendon fires appropriately.  There is moderate pes planus upon standing.  Full range of motion about the ankle in all directions, ligamentously intact with inversion/eversion stress testing.  Imaging:  *Reviewed [place] from 01/16/23: [place] Extrem Low Left Ltd Limited musculoskeletal ultrasound of the  left lower extremity was  performed.  Evaluation of the calcaneus was noted in short and long axis  without cortical regularity.  The Achilles tendon was viewed in short and  long axis without evidence of tearing and proper distal insertion on the  calcaneus.  There is a mild degree of hyperemia in the retrocalcaneal  bursa.  No thickness of the Achilles tendon at 0.50 cm in diameter.   Scanning proximally up to There is no tearing of the medial head of the  gastroc at the myotendinous junction.  Just distal to this near the  transition where a plantaris tendon may lie, there is some hypoechoic  fluid change and notable hyperemia, suggestive of possible plantaris  tendon tear.   XR of left ankle, 2-views from 01/11/23: 2 view radiographs of his left ankle reviewed today.  Well-maintained  alignment no evidence of any fracture.  On lateral he does have fluid in  the posterior leg that goes down to the calcaneus.   Past Medical/Family/Surgical/Social History: Medications & Allergies reviewed per EMR, new medications updated. Patient Active Problem List   Diagnosis Date Noted   Rupture of left plantaris tendon 01/11/2023   De Quervain's tenosynovitis 01/11/2023   Recurrent urticaria 01/21/2019   Chronic rhinitis 01/21/2019   History of penicillin allergy 01/21/2019   Allergic reaction 01/21/2019   Partial bowel obstruction ([facility]) 11/11/2016   Past Medical History:  Diagnosis Date   Deviated septum 08/2018   Recurrent urticaria 01/21/2019   Runny nose 09/18/2018   clear drainage, per mother   Sore throat 09/18/2018   Family History  Problem Relation Age of Onset   Diabetes Maternal Grandfather    Diabetes Paternal Grandfather    Healthy Mother    Healthy Father    Healthy Sister    Allergic rhinitis Neg Hx    Asthma Neg Hx    Eczema Neg Hx    Urticaria Neg Hx    Past Surgical History:  Procedure Laterality Date   APPENDECTOMY  10/31/2003   CENTRAL LINE INSERTION Right  10/31/2003   INGUINAL HERNIA REPAIR Left 03/30/2004   LAPAROSCOPY N/A 11/11/2016   Procedure: LAPAROSCOPY DIAGNOSTIC  AND EXPLORATORY LYSIS OF ADHESION;  Surgeon: Leonia Corona, MD;  Location: [facility];  Service: General;  Laterality: N/A;   SEPTOPLASTY  03/2018   SEPTOPLASTY N/A 09/25/2018   Procedure: REVISION SEPTOPLASTY;  Surgeon: Newman Pies, MD;  Location: [facility];  Service: ENT;  Laterality: N/A;   Social History   Occupational History   Not on file  Tobacco Use   Smoking status: Never   Smokeless tobacco: Never  Vaping Use   Vaping Use: Never used  Substance and Sexual Activity   Alcohol use: No   Drug use: No   Sexual activity: Never

## 2023-06-23 ENCOUNTER — Ambulatory Visit
Admission: RE | Admit: 2023-06-23 | Discharge: 2023-06-23 | Disposition: A | Discharge status: Home / Self Care | Payer: Medicaid Other | Source: Ambulatory Visit | Attending: Sports Medicine | Admitting: Sports Medicine

## 2023-06-23 DIAGNOSIS — M25572 Pain in left ankle and joints of left foot: Secondary | ICD-10-CM | POA: Diagnosis not present

## 2023-06-23 DIAGNOSIS — M62562 Muscle wasting and atrophy, not elsewhere classified, left lower leg: Secondary | ICD-10-CM

## 2023-06-23 DIAGNOSIS — G8929 Other chronic pain: Secondary | ICD-10-CM

## 2023-06-30 ENCOUNTER — Other Ambulatory Visit: Payer: Self-pay | Admitting: Family

## 2023-06-30 DIAGNOSIS — N62 Hypertrophy of breast: Secondary | ICD-10-CM | POA: Diagnosis not present

## 2023-06-30 DIAGNOSIS — N644 Mastodynia: Secondary | ICD-10-CM | POA: Diagnosis not present

## 2023-07-03 ENCOUNTER — Ambulatory Visit: Payer: Medicaid Other | Admitting: Sports Medicine

## 2023-07-05 ENCOUNTER — Ambulatory Visit: Payer: Medicaid Other | Admitting: Sports Medicine

## 2023-07-11 ENCOUNTER — Ambulatory Visit (INDEPENDENT_AMBULATORY_CARE_PROVIDER_SITE_OTHER): Payer: Medicaid Other | Admitting: Sports Medicine

## 2023-07-11 ENCOUNTER — Encounter: Payer: Self-pay | Admitting: Sports Medicine

## 2023-07-11 DIAGNOSIS — M2142 Flat foot [pes planus] (acquired), left foot: Secondary | ICD-10-CM | POA: Diagnosis not present

## 2023-07-11 DIAGNOSIS — M76822 Posterior tibial tendinitis, left leg: Secondary | ICD-10-CM

## 2023-07-11 DIAGNOSIS — G8929 Other chronic pain: Secondary | ICD-10-CM

## 2023-07-11 DIAGNOSIS — M25572 Pain in left ankle and joints of left foot: Secondary | ICD-10-CM | POA: Diagnosis not present

## 2023-07-11 DIAGNOSIS — M2141 Flat foot [pes planus] (acquired), right foot: Secondary | ICD-10-CM | POA: Diagnosis not present

## 2023-07-11 NOTE — Progress Notes (Addendum)
Gerald Neal - 22 y.o. male MRN [id number]  Date of birth: 21-Feb-2001  Office Visit Note: Visit Date: 07/11/2023 PCP: Donato Schultz, FNP Referred by: Donato Schultz, FNP  Subjective: No chief complaint on file.  HPI: Gerald Neal is a pleasant 22 y.o. male who presents today for follow-up of chronic left posterior ankle pain.  Gerald Neal has been noticing improvement in his strength and function of the posterior ankle.  He is having less popping than he had in the past.  He has been very consistent with the home exercises and Achilles strengthening exercises at the gym.  Did have an MRI performed, here for evaluation of this today.  Noticing more muscle bulk of the calf musculature as well.  Pertinent ROS were reviewed with the patient and found to be negative unless otherwise specified above in HPI.   Assessment & Plan: Visit Diagnoses:  1. Pes planus of both feet   2. Chronic pain of left ankle   3. Posterior tibial tendinitis of left lower extremity    Plan: Disussed with Gerald Neal nature of his chronic left ankle pain.  I do think he in the past suffered a plantaris injury as he did have some scar tissue in her ultrasound examination and less muscle bulk.  He has advanced through some home rehab exercises we have provided for him, I did alter this today and would like him to include posterior tibial tendon exercises, Gerald Neal heel drops with both concentric and eccentric exercise, as well as incorporating jump rope to help strengthen, he is agreeable to this.  We did review his MRI which reassured against any tendon tearing, Achilles or other intra-articular pathology.  Will allow him to progress through his home rehab and continue building muscle bulk and instability.  Discussed if he is not making the progress that he is continuing to make, we could always consider a few sessions of extracorporeal shockwave therapy to help augment healing.  He will call or message  me if he desires this, otherwise we will follow-up as needed.  Follow-up: Return if symptoms worsen or fail to improve.   Meds & Orders: No orders of the defined types were placed in this encounter.  No orders of the defined types were placed in this encounter.    Procedures: No procedures performed      Clinical History: No specialty comments available.  He reports that he has never smoked. He has never used smokeless tobacco. No results for input(s): "HGBA1C", "LABURIC" in the last 8760 hours.  Objective:   Vital Signs: There were no vitals taken for this visit.  Physical Exam  Gen: Well-appearing, in no acute distress; non-toxic CV: Regular Rate. Well-perfused. Warm.  Resp: Breathing unlabored on room air; no wheezing. Psych: Fluid speech in conversation; appropriate affect; normal thought process Neuro: Sensation intact throughout. No gross coordination deficits.   Ortho Exam - Left ankle/leg: There is improved muscle bulk although still very trivial amount of calf atrophy on the left compared to the right.  There is no specific tenderness to palpation of the calf musculature or palpating down the Achilles tendon or posterior tibial tendon.  There is full range of motion with dorsiflexion and plantarflexion of the foot.  Ligamentously intact.  He does appropriately fire his posterior tibial tendon although with repetitive use does lose some of the inversion indicative of the a mild degree of posterior tibial tendon dysfunction.  Imaging:  MR Ankle Left w/o contrast CLINICAL DATA:  Ankle pain, tendon abnormality suspected, questionable plantaris injury.  EXAM: MRI OF THE LEFT ANKLE WITHOUT CONTRAST  TECHNIQUE: Multiplanar, multisequence MR imaging of the ankle was performed. No intravenous contrast was administered.  COMPARISON:  Ankle radiographs dated January 11, 2023  FINDINGS: TENDONS  Peroneal: Peroneal longus tendon intact. Peroneal brevis  intact.  Posteromedial: Trace amount of fluid along the tibialis posterior suggesting tenosynovitis without evidence of tear. Flexor hallucis longus tendon intact. Flexor digitorum longus tendon intact.  Anterior: Tibialis anterior tendon intact. Extensor hallucis longus tendon intact Extensor digitorum longus tendon intact.  Achilles:  Intact.  Plantar Fascia: Intact.  LIGAMENTS  Lateral: Anterior talofibular ligament intact. Calcaneofibular ligament intact. Posterior talofibular ligament intact. Anterior and posterior tibiofibular ligaments intact.  Medial: Deltoid ligament intact. Spring ligament intact.  CARTILAGE  Ankle Joint: No joint effusion. Normal ankle mortise. No chondral defect.  Subtalar Joints/Sinus Tarsi: Normal subtalar joints. No subtalar joint effusion. Normal sinus tarsi.  Bones: No marrow signal abnormality.  No fracture or dislocation.  Soft Tissue: No fluid collection or hematoma. Muscles are normal without edema or atrophy. Tarsal tunnel is normal.  IMPRESSION: 1. Trace amount of fluid along the tibialis posterior suggesting tenosynovitis without evidence of tear. 2. No evidence of tendon tear. 3. Ligaments of the ankle are intact. 4. No evidence of fracture or dislocation. No significant joint effusion. 5. Marrow signal is within normal limits without evidence of fracture or osteonecrosis.  Electronically Signed   By: Gerald Neal D.O.   On: 06/23/2023 21:45    Past Medical/Family/Surgical/Social History: Medications & Allergies reviewed per EMR, new medications updated. Patient Active Problem List   Diagnosis Date Noted   Rupture of left plantaris tendon 01/11/2023   De Quervain's tenosynovitis 01/11/2023   Recurrent urticaria 01/21/2019   Chronic rhinitis 01/21/2019   History of penicillin allergy 01/21/2019   Allergic reaction 01/21/2019   Partial bowel obstruction ([facility]) 11/11/2016   Past Medical History:  Diagnosis Date    Deviated septum 08/2018   Recurrent urticaria 01/21/2019   Runny nose 09/18/2018   clear drainage, per mother   Sore throat 09/18/2018   Family History  Problem Relation Age of Onset   Diabetes Maternal Grandfather    Diabetes Paternal Grandfather    Healthy Mother    Healthy Father    Healthy Sister    Allergic rhinitis Neg Hx    Asthma Neg Hx    Eczema Neg Hx    Urticaria Neg Hx    Past Surgical History:  Procedure Laterality Date   APPENDECTOMY  10/31/2003   CENTRAL LINE INSERTION Right 10/31/2003   INGUINAL HERNIA REPAIR Left 03/30/2004   LAPAROSCOPY N/A 11/11/2016   Procedure: LAPAROSCOPY DIAGNOSTIC  AND EXPLORATORY LYSIS OF ADHESION;  Surgeon: Leonia Corona, MD;  Location: [facility];  Service: General;  Laterality: N/A;   SEPTOPLASTY  03/2018   SEPTOPLASTY N/A 09/25/2018   Procedure: REVISION SEPTOPLASTY;  Surgeon: Newman Pies, MD;  Location: [facility];  Service: ENT;  Laterality: N/A;   Social History   Occupational History   Not on file  Tobacco Use   Smoking status: Never   Smokeless tobacco: Never  Vaping Use   Vaping status: Never Used  Substance and Sexual Activity   Alcohol use: No   Drug use: No   Sexual activity: Never   I spent 34 minutes in the care of the patient today including face-to-face time, preparation to see the patient, as well as independent review and interpretation of  the MRI, review of MRI and previous ultrasound with patient in the room, discussion and advancement of home exercise protocol and rehab for the Achilles, plantar fascia and calf musculature for the above diagnoses.   Madelyn Brunner, DO Primary Care Sports Medicine Physician  [facility]  This note was dictated using Dragon naturally speaking software and may contain errors in syntax, spelling, or content which have not been identified prior to signing this note.

## 2023-07-19 DIAGNOSIS — H5213 Myopia, bilateral: Secondary | ICD-10-CM | POA: Diagnosis not present

## 2023-08-08 DIAGNOSIS — H5213 Myopia, bilateral: Secondary | ICD-10-CM | POA: Diagnosis not present

## 2023-08-09 ENCOUNTER — Ambulatory Visit
Admission: RE | Admit: 2023-08-09 | Discharge: 2023-08-09 | Disposition: A | Discharge status: Home / Self Care | Payer: Medicaid Other | Source: Ambulatory Visit | Attending: Family | Admitting: Family

## 2023-08-09 DIAGNOSIS — N62 Hypertrophy of breast: Secondary | ICD-10-CM

## 2023-08-09 DIAGNOSIS — N644 Mastodynia: Secondary | ICD-10-CM

## 2024-07-01 ENCOUNTER — Ambulatory Visit: Payer: Self-pay

## 2024-07-01 ENCOUNTER — Telehealth: Payer: Self-pay | Admitting: Family

## 2024-07-01 NOTE — Telephone Encounter (Signed)
 FYI Only or Action Required?: FYI only for provider.  Patient was last seen in primary care on new patient, N/A. Called Nurse Triage reporting Abdominal Pain and Weight Loss. Symptoms began several months ago. Interventions attempted: Nothing. Symptoms are: nausea, weight loss, painful swelling in left groin, abdominal pain gradually worsening.  Triage Disposition: See HCP Within 4 Hours (Or PCP Triage)  Patient/caregiver understands and will follow disposition?: Unsure                       Copied from CRM [id number]. Topic: Clinical - Red Word Triage >> Jul 01, 2024  8:49 AM Treva T wrote: [facility] that prompted transfer to Nurse Triage: Received call from mother of patient, Sherline Sanders, (DPR verified) calling states patient is having GI issues, weight loss, he feels like a lump is in the lower abdominal area, having abdominal pain, and loss of appetite. Reason for Disposition  [1] MILD-MODERATE pain AND [2] constant AND [3] present > 2 hours  Answer Assessment - Initial Assessment Questions Mother states 2 months ago patient had an episode of eating potatoes and if felt like it got stuck. She states since then the patient has had anorexia and is afraid to eat, he complains it hurts when he eats. Patient has lost about 10 pounds. She states the patient is out of town visiting family and his pain worsened this weekend so he went to the ED and was evaluated for his symptoms. She states he was told to establish with a PCP and get a referral to GI. Advised mother if symptoms worsen to go to urgent care or ED.  1. LOCATION: Where does it hurt?      Lower abdominal, all over pain after eating.  2. RADIATION: Does the pain shoot anywhere else? (e.g., chest, back)     All over.  3. ONSET: When did the pain begin? (Minutes, hours or days ago)      X 2 months.  4. SUDDEN: Gradual or sudden onset?     Sudden.  5. PATTERN Does the pain come and go, or is it  constant?    - If it comes and goes: How long does it last? Do you have pain now?     (Note: Comes and goes means the pain is intermittent. It goes away completely between bouts.)    - If constant: Is it getting better, staying the same, or getting worse?      (Note: Constant means the pain never goes away completely; most serious pain is constant and gets worse.)      Mother states the pain is always there but is worse anytime he eats.  6. SEVERITY: How bad is the pain?  (e.g., Scale 1-10; mild, moderate, or severe)    - MILD (1-3): Doesn't interfere with normal activities, abdomen soft and not tender to touch.     - MODERATE (4-7): Interferes with normal activities or awakens from sleep, abdomen tender to touch.     - SEVERE (8-10): Excruciating pain, doubled over, unable to do any normal activities.       Mild/low.  7. RECURRENT SYMPTOM: Have you ever had this type of stomach pain before? If Yes, ask: When was the last time? and What happened that time?      She states all his life he has had GI issues but they never found anything.  8. CAUSE: What do you think is causing the stomach pain?  Unsure.  9. RELIEVING/AGGRAVATING FACTORS: What makes it better or worse? (e.g., antacids, bending or twisting motion, bowel movement)     Worse after eating, walking makes the groin lump worse.  10. OTHER SYMPTOMS: Do you have any other symptoms? (e.g., back pain, diarrhea, fever, urination pain, vomiting)       Nausea, weight loss, lower groin (she thinks on the left side) swelling (she states he had a hernia in the past).  Protocols used: Abdominal Pain - Male-A-AH

## 2024-07-01 NOTE — Telephone Encounter (Signed)
 Copied from CRM [id number]. Topic: Appointments - Scheduling Inquiry for Clinic >> Jul 01, 2024  8:40 AM Treva T wrote:  Reason for CRM: Received call from mother of patient, Gerald Neal, (DPR verified) calling to schedule patient a new patient appointment for GI issues, weight loss, he feels like a lump is in the lower abdominal area, having abdominal pain, and loss of appetite.   Per K[facility], Red Word CRM sent to nurse triage also.  Patient mother is already a patient of the office, and is inquiring if her son, the patient, can be accepted as a new patient.  Patient mother can be reached at [phone].

## 2024-07-01 NOTE — Telephone Encounter (Signed)
 Called and spoke to the patient's mother, verified name and date of birth. Informed patient's mother that our office is currently not accepting new patient's, informed the mother to contact the [facility] as that office is accepting new patient's.

## 2024-07-08 ENCOUNTER — Encounter: Payer: Self-pay | Admitting: Sports Medicine

## 2024-07-10 ENCOUNTER — Ambulatory Visit

## 2024-07-25 ENCOUNTER — Other Ambulatory Visit (HOSPITAL_COMMUNITY)
Admission: RE | Admit: 2024-07-25 | Discharge: 2024-07-25 | Disposition: A | Discharge status: Home / Self Care | Source: Ambulatory Visit

## 2024-07-25 ENCOUNTER — Ambulatory Visit

## 2024-07-25 VITALS — BP 120/73 | HR 64 | Temp 98.3°F | Resp 16 | Ht 67.0 in | Wt 143.8 lb

## 2024-07-25 DIAGNOSIS — R300 Dysuria: Secondary | ICD-10-CM

## 2024-07-25 DIAGNOSIS — Z114 Encounter for screening for human immunodeficiency virus [HIV]: Secondary | ICD-10-CM | POA: Diagnosis not present

## 2024-07-25 DIAGNOSIS — K30 Functional dyspepsia: Secondary | ICD-10-CM | POA: Diagnosis not present

## 2024-07-25 DIAGNOSIS — G43019 Migraine without aura, intractable, without status migrainosus: Secondary | ICD-10-CM

## 2024-07-25 DIAGNOSIS — R1909 Other intra-abdominal and pelvic swelling, mass and lump: Secondary | ICD-10-CM | POA: Diagnosis not present

## 2024-07-25 DIAGNOSIS — Z113 Encounter for screening for infections with a predominantly sexual mode of transmission: Secondary | ICD-10-CM | POA: Diagnosis not present

## 2024-07-25 DIAGNOSIS — K219 Gastro-esophageal reflux disease without esophagitis: Secondary | ICD-10-CM

## 2024-07-25 DIAGNOSIS — Z7689 Persons encountering health services in other specified circumstances: Secondary | ICD-10-CM

## 2024-07-25 DIAGNOSIS — Z1159 Encounter for screening for other viral diseases: Secondary | ICD-10-CM | POA: Diagnosis not present

## 2024-07-25 LAB — POCT URINALYSIS DIP (CLINITEK)
Bilirubin, UA: NEGATIVE
Glucose, UA: NEGATIVE mg/dL
Ketones, POC UA: NEGATIVE mg/dL
Leukocytes, UA: NEGATIVE
Nitrite, UA: NEGATIVE
POC PROTEIN,UA: NEGATIVE
Spec Grav, UA: 1.015 (ref 1.010–1.025)
Urobilinogen, UA: 0.2 U/dL — NL
pH, UA: 6.5 (ref 5.0–8.0)

## 2024-07-25 MED ORDER — RIZATRIPTAN BENZOATE 10 MG PO TABS: ORAL_TABLET | ORAL | 1 refills | Status: DC

## 2024-07-25 NOTE — Progress Notes (Unsigned)
 Patient ID: Gerald Neal, male    DOB: [birth year]  MRN: [id number]  CC: Establish Care (Patient is here to established care with provider./~health hx address/~care gaps address//Patient would like a referral for GI/ )   Subjective: Gerald Neal is a 23 y.o. male with past medical history of partial bowel obstruction, appendectomy, generalized anxiety disorder who presents to clinic to establish care. Main concern today is 3 month history of difficulty swallowing, weight loss and poor appetite. Associated symptoms include having to cut up food into smaller bites, feeling of regurgitation of food, intermittent upper abdominal pain.  Reports feeling a bulge in his groin on the right side that goes away when he lays down and is more prominent with increased abdominal pressure. History of hernia repair as a child. Reports he has been told that he has thickened bladder wall on CT scan. Reports occasional pinkish urine and pain with urination for the past 4 months. Denies fever. Reports history of sexual relations with men and woman. In the past, pt reports pain and some bleeding from his anus after anal intercourse with a man. Pt would like STD testing.   Social History   Socioeconomic History   Marital status: Single    Spouse name: Not on file   Number of children: 0   Years of education: Not on file   Highest education level: High school graduate  Occupational History   Not on file  Tobacco Use   Smoking status: Never   Smokeless tobacco: Never  Vaping Use   Vaping status: Never Used  Substance and Sexual Activity   Alcohol use: No   Drug use: No   Sexual activity: Never  Other Topics Concern   Not on file  Social History Narrative   ** Merged History Encounter **       Pt is single lives with parents He has no children He currently is enrolled in college- high school graduate. Right handed Drinks coffee once month, no tea, no soda    Social Drivers  of Corporate investment banker Strain: Not at Risk (04/04/2023)   Received from [employer]    Financial Resource Strain: 1  Food Insecurity: Not at Risk (04/04/2023)   Received from [employer]    Food: 1  Transportation Needs: Not at Risk (04/04/2023)   Received from [employer] Needs    Transportation: 1  Physical Activity: Not at Risk (01/19/2023)   [facility] from [facility]   Physical Activity    Physical Activity: 1  Stress: No Stress Concern Present (07/25/2024)   [employer] of Occupational Health - Occupational Stress Questionnaire    Feeling of Stress: Not at all  Social Connections: Socially Isolated (07/25/2024)   Social Connection and Isolation Panel    Frequency of Communication with Friends and Family: Once a week    Frequency of Social Gatherings with Friends and Family: Once a week    Attends Religious Services: Never    Database administrator or Organizations: No    Attends Banker Meetings: Never    Marital Status: Living with partner  Intimate Partner Violence: Not At Risk (07/25/2024)   Humiliation, Afraid, Rape, and Kick questionnaire    Fear of Current or Ex-Partner: No    Emotionally Abused: No    Physically Abused: No    Sexually Abused: No    Family History  Problem Relation Age of Onset  Diabetes Maternal Grandfather    Diabetes Paternal Grandfather    Healthy Mother    Healthy Father    Healthy Sister    Allergic rhinitis Neg Hx    Asthma Neg Hx    Eczema Neg Hx    Urticaria Neg Hx     Past Surgical History:  Procedure Laterality Date   APPENDECTOMY  10/31/2003   CENTRAL LINE INSERTION Right 10/31/2003   INGUINAL HERNIA REPAIR Left 03/30/2004   LAPAROSCOPY N/A 11/11/2016   Procedure: LAPAROSCOPY DIAGNOSTIC  AND EXPLORATORY LYSIS OF ADHESION;  Surgeon: Julietta Millman, MD;  Location: [facility];  Service: General;  Laterality: N/A;   SEPTOPLASTY  03/2018   SEPTOPLASTY N/A 09/25/2018    Procedure: REVISION SEPTOPLASTY;  Surgeon: Karis Clunes, MD;  Location: [facility];  Service: ENT;  Laterality: N/A;    ROS: Review of Systems Negative except as stated above  PHYSICAL EXAM: BP 120/73   Pulse 64   Temp 98.3 F (36.8 C) (Oral)   Resp 16   Ht 5' 7 (1.702 m)   Wt 143 lb 12.8 oz (65.2 kg)   SpO2 96%   BMI 22.52 kg/m   Physical Exam  General: well-appearing, no acute distress Skin: no jaundice, rashes, or lesions Cardiovascular: regular heart rate and rhythm, normal S1/S2, no murmurs, gallops, or rubs Chest: no skeletal deformity, lungs clear to auscultation bilaterally, equal breath sounds bilaterally Abdomen: soft, non-distended, non-tender to palpation, normoactive bowel sounds. Slightly protruding soft tissue mass palpated on R side of suprapubic area, increased in size when switching position from supine>sit. GU: Scrotum equal size bilaterally with testes present, no signs of edema, inguinal hernia not palpated during exam into inguinal canal.  Musculoskeletal: strength of upper and lower extremities equal bilaterally, normal gait Extremities: no peripheral edema    ASSESSMENT AND PLAN:  1. Encounter to establish care with new provider (Primary)  2. Intractable migraine without aura and without status migrainosus - History of migraines but have been less frequent, requested refill for prn med. - Refill provided for rizatriptan  (MAXALT ) 10 MG tablet; TAKE 1 TABLET BY MOUTH AS NEEDED FOR MIGRAINE. MAY REPEAT IN 2 HOURS IF NEEDED  Dispense: 10 tablet; Refill: 1  3. Persistent indigestion - Ambulatory referral to Gastroenterology -See below (4.)  4. Gastroesophageal reflux disease, unspecified whether esophagitis present - Given reports of fear of eating due to symptoms of difficulty digesting food and regurgitation, improper closure of upper esophageal sphincter or narrowing of distal esophagus is suspected. Referral placed for gastroenterology  for barrium swallow study or endoscopy.   5. Bulge in groin area - Ambulatory referral to Gastroenterology to further evaluate R suprapubic soft tissue mass with ultrasound or other imaging modality  6. Dysuria and possible hematuria - Ambulatory referral to Urology to further assess urinary symptoms  7. Screen for sexually transmitted diseases - Per pt request ordering STD testing - Urine cytology ancillary only - HIV Antibody (routine testing w rflx) - RPR - PSA    Patient was given the opportunity to ask questions.  Patient verbalized understanding of the plan and was able to repeat key elements of the plan. Patient was given clear instructions to go to Emergency Department or return to medical center if symptoms don't improve, worsen, or new problems develop.The patient verbalized understanding.   Orders Placed This Encounter  Procedures   HIV Antibody (routine testing w rflx)   Hepatitis C antibody   RPR   PSA   Ambulatory referral to  Gastroenterology   Ambulatory referral to Urology   POCT URINALYSIS DIP (CLINITEK)     Requested Prescriptions   Signed Prescriptions Disp Refills   rizatriptan  (MAXALT ) 10 MG tablet 10 tablet 1    Sig: TAKE 1 TABLET BY MOUTH AS NEEDED FOR MIGRAINE. MAY REPEAT IN 2 HOURS IF NEEDED    Return in about 1 month (around 08/25/2024) for physical, labs.  Sula Leavy Rode, PA-C

## 2024-07-26 DIAGNOSIS — K219 Gastro-esophageal reflux disease without esophagitis: Secondary | ICD-10-CM | POA: Insufficient documentation

## 2024-07-26 LAB — HEPATITIS C ANTIBODY: Hep C Virus Ab: NONREACTIVE

## 2024-07-26 LAB — URINE CYTOLOGY ANCILLARY ONLY
Chlamydia: NEGATIVE
Comment: NEGATIVE
Comment: NEGATIVE
Comment: NORMAL
Neisseria Gonorrhea: NEGATIVE
Trichomonas: NEGATIVE

## 2024-07-26 LAB — HIV ANTIBODY (ROUTINE TESTING W REFLEX): HIV Screen 4th Generation wRfx: NONREACTIVE

## 2024-07-26 LAB — PSA: Prostate Specific Ag, Serum: 0.4 ng/mL (ref 0.0–4.0)

## 2024-07-26 LAB — RPR: RPR Ser Ql: NONREACTIVE

## 2024-07-29 ENCOUNTER — Encounter: Payer: Self-pay | Admitting: Internal Medicine

## 2024-07-29 ENCOUNTER — Other Ambulatory Visit: Payer: Self-pay

## 2024-07-29 ENCOUNTER — Ambulatory Visit: Payer: Self-pay

## 2024-07-29 DIAGNOSIS — G43019 Migraine without aura, intractable, without status migrainosus: Secondary | ICD-10-CM

## 2024-07-29 NOTE — Progress Notes (Signed)
 Good news! Your STD panel was negative, no sexually transmitted infections.  HIV and Hep C screenings were negative.  Your prostate number was within normal ranges. No concerns at this time in regards to your lab results, take care!

## 2024-08-05 DIAGNOSIS — R3121 Asymptomatic microscopic hematuria: Secondary | ICD-10-CM | POA: Diagnosis not present

## 2024-08-07 ENCOUNTER — Ambulatory Visit (HOSPITAL_COMMUNITY)
Admission: EM | Admit: 2024-08-07 | Discharge: 2024-08-07 | Disposition: A | Discharge status: Home / Self Care | Attending: Family Medicine | Admitting: Family Medicine

## 2024-08-07 ENCOUNTER — Ambulatory Visit: Payer: Self-pay

## 2024-08-07 ENCOUNTER — Encounter (HOSPITAL_COMMUNITY): Payer: Self-pay

## 2024-08-07 DIAGNOSIS — L237 Allergic contact dermatitis due to plants, except food: Secondary | ICD-10-CM

## 2024-08-07 MED ORDER — CETIRIZINE HCL 10 MG PO TABS: 10.0000 mg | ORAL_TABLET | Freq: Every day | ORAL | 0 refills | Status: AC | PRN

## 2024-08-07 MED ORDER — PREDNISONE 20 MG PO TABS: 40.0000 mg | ORAL_TABLET | Freq: Every day | ORAL | 0 refills | Status: AC

## 2024-08-07 NOTE — ED Triage Notes (Addendum)
 Pt states itchy rash to both of his legs after mowing grass.  States he has been taking benadryl  at home but the rash is spreading. Red raised rash noted to BLE.

## 2024-08-07 NOTE — ED Provider Notes (Signed)
 [facility]    CSN: [id number] Arrival date & time: 08/07/24  0901      History   Chief Complaint Chief Complaint  Patient presents with   Rash    HPI Gerald Neal is a 23 y.o. male.    Rash Here for rash and itching on his lower leg.  Last week he had been similar weed eating and after that noticed some rash and itching on his anterior lower legs.  Then he has noticed some more rash around his knees that developed a few days later.  No fever  No trouble breathing and no sensation that his lips or tongue or throat or swelling.  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   Pain with ejaculation 07/21/2021   Recurrent urticaria 01/21/2019   Chronic rhinitis 01/21/2019   History of penicillin allergy  01/21/2019   Allergic reaction 01/21/2019   Partial bowel obstruction (HCC) 11/11/2016   Abdominal pain 03/13/2012   Migraine 08/12/2009    Past Surgical History:  Procedure Laterality Date   APPENDECTOMY  10/31/2003   CENTRAL LINE INSERTION Right 10/31/2003   INGUINAL HERNIA REPAIR Left 03/30/2004   LAPAROSCOPY N/A 11/11/2016   Procedure: LAPAROSCOPY DIAGNOSTIC  AND EXPLORATORY LYSIS OF ADHESION;  Surgeon: Julietta Millman, MD;  Location: [facility];  Service: General;  Laterality: N/A;   SEPTOPLASTY  03/2018   SEPTOPLASTY N/A 09/25/2018   Procedure: REVISION SEPTOPLASTY;  Surgeon: Karis Clunes, MD;  Location:  [facility];  Service: ENT;  Laterality: N/A;       Home Medications    Prior to Admission medications   Medication Sig Start Date End Date Taking? Authorizing Provider  cetirizine  (ZYRTEC  ALLERGY ) 10 MG tablet Take 1 tablet (10 mg total) by mouth daily as needed for allergies. 08/07/24  Yes Vonna Sharlet POUR, MD  predniSONE  (DELTASONE ) 20 MG tablet Take 2 tablets (40 mg total) by mouth daily with breakfast for 5 days. 08/07/24 08/12/24 Yes Samirah Scarpati, Sharlet POUR, MD  rizatriptan  (MAXALT ) 10 MG tablet TAKE 1 TABLET BY MOUTH AS NEEDED FOR MIGRAINE. MAY REPEAT IN 2 HOURS IF NEEDED 07/25/24   Leavy Lucas Fox, PA-C    Family History Family History  Problem Relation Age of Onset   Diabetes Maternal Grandfather    Diabetes Paternal Grandfather    Healthy Mother    Healthy Father    Healthy Sister    Allergic rhinitis Neg Hx    Asthma Neg Hx    Eczema Neg Hx    Urticaria Neg Hx     Social History Social History   Tobacco Use   Smoking status: Never   Smokeless tobacco: Never  Vaping Use   Vaping status: Never Used  Substance Use Topics   Alcohol use: No   Drug use: No     Allergies   Patient has no known allergies.   Review of Systems Review of Systems  Skin:  Positive for rash.     Physical Exam Triage Vital Signs ED Triage Vitals  Encounter Vitals Group     BP 08/07/24 0922 112/67     Girls Systolic BP Percentile --      Girls Diastolic BP Percentile --      Boys Systolic BP Percentile --      Boys Diastolic BP Percentile --      Pulse Rate 08/07/24 0920 73     Resp 08/07/24 0920 16     Temp 08/07/24 0920 98.1 F (36.7 C)     Temp Source 08/07/24 0920 Oral     SpO2 08/07/24 0920 96 %     Weight --      Height --      Head Circumference --      Peak Flow --      Pain Score --      Pain Loc --      Pain Education --      Exclude from Growth Chart --    No data found.  Updated Vital Signs BP 112/67 (BP Location: Right Arm)   Pulse 73    Temp 98.1 F (36.7 C) (Oral)   Resp 16   SpO2 96%   Visual Acuity Right Eye Distance:   Left Eye Distance:   Bilateral Distance:    Right Eye Near:   Left Eye Near:    Bilateral Near:     Physical Exam Vitals reviewed.  Constitutional:      General: He is not in acute distress.    Appearance: He is not toxic-appearing.  HENT:     Nose: Nose normal.     Mouth/Throat:     Mouth: Mucous membranes are moist.     Pharynx: No oropharyngeal exudate or posterior oropharyngeal erythema.     Comments: No swelling of the lips tongue or lips and oropharynx is benign Eyes:     Extraocular Movements: Extraocular movements intact.     Conjunctiva/sclera: Conjunctivae normal.     Pupils: Pupils are equal, round, and reactive to light.  Cardiovascular:     Rate and Rhythm: Normal rate and regular rhythm.     Heart sounds: No murmur heard. Pulmonary:     Effort: Pulmonary effort is normal. No respiratory distress.     Breath sounds: Normal breath sounds. No stridor. No wheezing, rhonchi or rales.  Musculoskeletal:     Cervical back: Neck supple.  Lymphadenopathy:     Cervical: No cervical adenopathy.  Skin:    Coloration: Skin is not jaundiced or pale.     Comments: There is a maculopapular rash on his shins and a few papules up on his knees bilaterally.  No drainage and no sign of secondary infection.  He does also show me a few scattered other erythematous bumps on his forearms.    Neurological:     General: No focal deficit present.     Mental Status: He is alert and oriented to person, place, and time.  Psychiatric:        Behavior: Behavior normal.      UC Treatments / Results  Labs (all labs ordered are listed, but only abnormal results are displayed) Labs Reviewed - No data to display  EKG   Radiology No results found.  Procedures Procedures (including critical care time)  Medications Ordered in UC Medications - No data to display  Initial Impression /  Assessment and Plan / UC Course  I have reviewed the triage  vital signs and the nursing notes.  Pertinent labs & imaging results that were available during my care of the patient were reviewed by me and considered in my medical decision making (see chart for details).     5 days of prednisone  are sent in to treat the allergic dermatitis  Zyrtec  is sent into use as an alternative to the Benadryl . Final Clinical Impressions(s) / UC Diagnoses   Final diagnoses:  Allergic contact dermatitis due to plants, except food     Discharge Instructions      Take prednisone  20 mg--2 daily for 5 days  Zyrtec /cetirizine  10 mg tablet--take 1 daily as needed for allergy  or itching.      ED Prescriptions     Medication Sig Dispense Auth. Provider   predniSONE  (DELTASONE ) 20 MG tablet Take 2 tablets (40 mg total) by mouth daily with breakfast for 5 days. 10 tablet Chimamanda Siegfried K, MD   cetirizine  (ZYRTEC  ALLERGY ) 10 MG tablet Take 1 tablet (10 mg total) by mouth daily as needed for allergies. 30 tablet Infant Zink K, MD      PDMP not reviewed this encounter.   Vonna Sharlet POUR, MD 08/07/24 [phone]

## 2024-08-07 NOTE — Discharge Instructions (Signed)
 Take prednisone  20 mg--2 daily for 5 days  Zyrtec /cetirizine  10 mg tablet--take 1 daily as needed for allergy  or itching.

## 2024-08-07 NOTE — Telephone Encounter (Signed)
 Pt was seen at [facility] and has appointment with provider on 08/27/24 at 2

## 2024-08-07 NOTE — Telephone Encounter (Signed)
 FYI Only or Action Required?: FYI only for provider.  Patient was last seen in primary care on .  Called Nurse Triage reporting Rash/blisters.  Symptoms began last Tuesday .  Interventions attempted: OTC medications: benadryl .  Symptoms are: gradually worsening.  Triage Disposition: See HCP Within 4 Hours (Or PCP Triage) To mobile bus  Patient/caregiver understands and will follow disposition?: yes    Copied from CRM [id number]. Topic: Clinical - Red Word Triage >> Aug 07, 2024  8:16 AM Precious C wrote: [facility] that prompted transfer to Nurse Triage: RASH/ITCHING  Patient called stating he developed a rash on his legs and shins after cutting bushes outside. He wanted to schedule an appointment, but due to the severity of the rash and discomfort transferred to triage Reason for Disposition  [1] Looks infected (e.g., spreading redness, pus) AND [2] large red area (> 2 inches or 5 cm)  Answer Assessment - Initial Assessment Questions 1. APPEARANCE of RASH: What does the rash look like? (e.g., blisters, dry flaky skin, red spots, redness, sores)    blisters 2. LOCATION: Where is the rash located?     Bilateral  Lower legs shin, back of leg and back of knee 3. NUMBER: How many spots are there?  4. SIZE: How big are the spots? (e.g., inches, cm; or compare to size of pinhead, tip of pen, eraser, pea)      *No Answer* 5. ONSET: When did the rash start?      Last Tuesday  6. ITCHING: Does the rash itch? If Yes, ask: How bad is the itch?  (Scale 0-10; or none, mild, moderate, severe)     yes 7. PAIN: Does the rash hurt? If Yes, ask: How bad is the pain?  (Scale 0-10; or none, mild, moderate, severe)     Mild  8. OTHER SYMPTOMS: Do you have any other symptoms? (e.g., fever)     General itchy  Protocols used: Rash or Redness - Localized-A-AH

## 2024-08-22 ENCOUNTER — Encounter: Payer: Self-pay | Admitting: Gastroenterology

## 2024-08-22 ENCOUNTER — Ambulatory Visit (INDEPENDENT_AMBULATORY_CARE_PROVIDER_SITE_OTHER): Admitting: Gastroenterology

## 2024-08-22 VITALS — BP 104/72 | HR 84 | Ht 67.0 in | Wt 141.0 lb

## 2024-08-22 DIAGNOSIS — K2100 Gastro-esophageal reflux disease with esophagitis, without bleeding: Secondary | ICD-10-CM

## 2024-08-22 DIAGNOSIS — K219 Gastro-esophageal reflux disease without esophagitis: Secondary | ICD-10-CM

## 2024-08-22 DIAGNOSIS — R1012 Left upper quadrant pain: Secondary | ICD-10-CM | POA: Diagnosis not present

## 2024-08-22 DIAGNOSIS — R071 Chest pain on breathing: Secondary | ICD-10-CM

## 2024-08-22 DIAGNOSIS — R1310 Dysphagia, unspecified: Secondary | ICD-10-CM

## 2024-08-22 DIAGNOSIS — R7401 Elevation of levels of liver transaminase levels: Secondary | ICD-10-CM

## 2024-08-22 DIAGNOSIS — R634 Abnormal weight loss: Secondary | ICD-10-CM | POA: Diagnosis not present

## 2024-08-22 DIAGNOSIS — R933 Abnormal findings on diagnostic imaging of other parts of digestive tract: Secondary | ICD-10-CM | POA: Diagnosis not present

## 2024-08-22 DIAGNOSIS — K4090 Unilateral inguinal hernia, without obstruction or gangrene, not specified as recurrent: Secondary | ICD-10-CM

## 2024-08-22 DIAGNOSIS — R9389 Abnormal findings on diagnostic imaging of other specified body structures: Secondary | ICD-10-CM | POA: Diagnosis not present

## 2024-08-22 MED ORDER — NA SULFATE-K SULFATE-MG SULF 17.5-3.13-1.6 GM/177ML PO SOLN: 1.0000 | Freq: Once | ORAL | 0 refills | Status: AC

## 2024-08-22 NOTE — Patient Instructions (Signed)
 Referral placed to [facility] for hernia repair.   You have been scheduled for a colonoscopy. Please follow written instructions given to you at your visit today.   If you use inhalers (even only as needed), please bring them with you on the day of your procedure.  DO NOT TAKE 7 DAYS PRIOR TO TEST- Trulicity (dulaglutide) Ozempic, Wegovy (semaglutide) Mounjaro (tirzepatide) Bydureon Bcise (exanatide extended release)  DO NOT TAKE 1 DAY PRIOR TO YOUR TEST Rybelsus (semaglutide) Adlyxin (lixisenatide) Victoza (liraglutide) Byetta (exanatide) ________________________________________________________________________  _______________________________________________________  If your blood pressure at your visit was 140/90 or greater, please contact your primary care physician to follow up on this.  _______________________________________________________  If you are age 29 or older, your body mass index should be between 23-30. Your Body mass index is 22.08 kg/m. If this is out of the aforementioned range listed, please consider follow up with your Primary Care Provider.  If you are age 46 or younger, your body mass index should be between 19-25. Your Body mass index is 22.08 kg/m. If this is out of the aformentioned range listed, please consider follow up with your Primary Care Provider.   ________________________________________________________  The [facility] providers would like to encourage you to use MYCHART to communicate with providers for non-urgent requests or questions.  Due to long hold times on the telephone, sending your provider a message by [facility] may be a faster and more efficient way to get a response.  Please allow 48 business hours for a response.  Please remember that this is for non-urgent requests.  _______________________________________________________  [facility] is using a team-based approach to care.  Your team is made up of your  doctor and two to three APPS. Our APPS (Nurse Practitioners and Physician Assistants) work with your physician to ensure care continuity for you. They are fully qualified to address your health concerns and develop a treatment plan. They communicate directly with your gastroenterologist to care for you. Seeing the Advanced Practice Practitioners on your physician's team can help you by facilitating care more promptly, often allowing for earlier appointments, access to diagnostic testing, procedures, and other specialty referrals.

## 2024-08-22 NOTE — Progress Notes (Unsigned)
     08/22/2024 Gerald Neal [id number] [birth year]   HISTORY OF PRESENT ILLNESS:   Past Medical History:  Diagnosis Date   Acute left ankle pain 05/19/2023   Deviated septum 08/2018   Recurrent urticaria 01/21/2019   Runny nose 09/18/2018   clear drainage, per mother   Rupture of left plantaris tendon 01/11/2023   Sore throat 09/18/2018   Past Surgical History:  Procedure Laterality Date   APPENDECTOMY  10/31/2003   CENTRAL LINE INSERTION Right 10/31/2003   INGUINAL HERNIA REPAIR Left 03/30/2004   LAPAROSCOPY N/A 11/11/2016   Procedure: LAPAROSCOPY DIAGNOSTIC  AND EXPLORATORY LYSIS OF ADHESION;  Surgeon: Julietta Millman, MD;  Location: [facility];  Service: General;  Laterality: N/A;   SEPTOPLASTY  03/2018   SEPTOPLASTY N/A 09/25/2018   Procedure: REVISION SEPTOPLASTY;  Surgeon: Karis Clunes, MD;  Location: [facility];  Service: ENT;  Laterality: N/A;    reports that he has never smoked. He has never used smokeless tobacco. He reports that he does not drink alcohol and does not use drugs. family history includes Diabetes in his maternal grandfather and paternal grandfather; Healthy in his father, mother, and sister. No Known Allergies    Outpatient Encounter Medications as of 08/22/2024  Medication Sig   cetirizine  (ZYRTEC  ALLERGY ) 10 MG tablet Take 1 tablet (10 mg total) by mouth daily as needed for allergies.   rizatriptan  (MAXALT ) 10 MG tablet TAKE 1 TABLET BY MOUTH AS NEEDED FOR MIGRAINE. MAY REPEAT IN 2 HOURS IF NEEDED (Patient not taking: Reported on 08/22/2024)   No facility-administered encounter medications on file as of 08/22/2024.    REVIEW OF SYSTEMS  : All other systems reviewed and negative except where noted in the History of Present Illness.   PHYSICAL EXAM: BP 104/72   Pulse 84   Ht 5' 7 (1.702 m)   Wt 141 lb (64 kg)   BMI 22.08 kg/m  General: Well developed male in no acute distress Head: Normocephalic and atraumatic Eyes:   Sclerae anicteric, conjunctiva pink. Ears: Normal auditory acuity Lungs: Clear throughout to auscultation; no W/R/R. Heart: Regular rate and rhythm; no M/R/G. Abdomen: Soft, non-distended.  BS present.  Non-tender. Rectal:  Will be done at the time of colonoscopy. Musculoskeletal: Symmetrical with no gross deformities  Skin: No lesions on visible extremities Extremities: No edema  Neurological: Alert oriented x 4, grossly non-focal Psychological:  Alert and cooperative. Normal mood and affect  ASSESSMENT AND PLAN:    CC:  Leavy Lucas Fox, PA*

## 2024-08-23 ENCOUNTER — Encounter: Payer: Self-pay | Admitting: Gastroenterology

## 2024-08-23 DIAGNOSIS — R1012 Left upper quadrant pain: Secondary | ICD-10-CM | POA: Insufficient documentation

## 2024-08-23 DIAGNOSIS — R1310 Dysphagia, unspecified: Secondary | ICD-10-CM | POA: Insufficient documentation

## 2024-08-23 DIAGNOSIS — R9389 Abnormal findings on diagnostic imaging of other specified body structures: Secondary | ICD-10-CM | POA: Insufficient documentation

## 2024-08-26 HISTORY — PX: UPPER GI ENDOSCOPY: SHX6162

## 2024-08-26 HISTORY — PX: COLONOSCOPY: SHX174

## 2024-08-27 ENCOUNTER — Encounter: Payer: Self-pay | Admitting: Gastroenterology

## 2024-08-27 ENCOUNTER — Ambulatory Visit (INDEPENDENT_AMBULATORY_CARE_PROVIDER_SITE_OTHER)

## 2024-08-27 VITALS — BP 101/66 | HR 73 | Temp 97.9°F | Resp 16 | Ht 68.0 in | Wt 144.8 lb

## 2024-08-27 DIAGNOSIS — Z1329 Encounter for screening for other suspected endocrine disorder: Secondary | ICD-10-CM | POA: Diagnosis not present

## 2024-08-27 DIAGNOSIS — Z0000 Encounter for general adult medical examination without abnormal findings: Secondary | ICD-10-CM

## 2024-08-27 DIAGNOSIS — Z130 Encounter for screening for diseases of the blood and blood-forming organs and certain disorders involving the immune mechanism: Secondary | ICD-10-CM | POA: Diagnosis not present

## 2024-08-27 DIAGNOSIS — Z13228 Encounter for screening for other metabolic disorders: Secondary | ICD-10-CM | POA: Diagnosis not present

## 2024-08-27 DIAGNOSIS — Z136 Encounter for screening for cardiovascular disorders: Secondary | ICD-10-CM | POA: Diagnosis not present

## 2024-08-27 DIAGNOSIS — Z13220 Encounter for screening for lipoid disorders: Secondary | ICD-10-CM

## 2024-08-27 DIAGNOSIS — R1310 Dysphagia, unspecified: Secondary | ICD-10-CM | POA: Diagnosis not present

## 2024-08-27 NOTE — Progress Notes (Addendum)
 Patient ID: Gerald Neal, male    DOB: 08-21-2001  MRN: [id number]  CC: Establish Care (-Patient is here to have annually  complete physical examination /-Care gap address/-labs taken )   Subjective: Gerald Neal is a 23 y.o. male who presents to clinic for yearly physical exam and labs. Pt reports he has follow scheduled with GI specialist for imaging to address swallowing issues and abdominal discomfort. No acute concerns at this time.    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 30 tablet 0   rizatriptan  (MAXALT ) 10 MG tablet TAKE 1 TABLET BY MOUTH AS NEEDED FOR MIGRAINE. MAY REPEAT IN 2 HOURS IF NEEDED (Patient not taking: Reported on 08/22/2024) 10 tablet 1   No current facility-administered medications on file prior to visit.    No Known Allergies  Social History   Socioeconomic History   Marital status: Single    Spouse name: Not on file   Number of children: 0   Years of education: Not on file   Highest education level: Some college, no degree  Occupational History    Not on file  Tobacco Use   Smoking status: Never   Smokeless tobacco: Never  Vaping Use   Vaping status: Never Used  Substance and Sexual Activity   Alcohol use: No   Drug use: No   Sexual activity: Never  Other Topics Concern   Not on file  Social History Narrative   ** Merged History Encounter **       Pt is single lives with parents He has no children He currently is enrolled in college- high school graduate. Right handed Drinks coffee once month, no tea, no soda    Social Drivers of Corporate investment banker Strain: High Risk (08/27/2024)   Overall Financial Resource Strain (CARDIA)    Difficulty of Paying Living Expenses: Very hard  Food Insecurity: Patient Declined (08/27/2024)   Hunger Vital Sign    Worried About Running Out of Food in the Last Year: Patient declined    Ran Out of Food in the Last Year: Patient declined  Transportation Needs: No Transportation Needs (08/27/2024)   PRAPARE - Administrator, Civil Service (Medical): No    Lack of Transportation (Non-Medical): No  Physical Activity: Insufficiently Active (08/27/2024)   Exercise Vital Sign    Days of Exercise per Week: 1 day    Minutes of Exercise per Session: 10 min  Stress: No Stress Concern Present (08/27/2024)   [employer] of Occupational Health - Occupational Stress Questionnaire    Feeling of Stress: Only a little  Social Connections: Moderately Isolated (08/27/2024)   Social Connection and Isolation Panel    Frequency of Communication with Friends and Family: More than three times a week    Frequency of Social Gatherings with Friends and Family: Three times a week    Attends Religious Services: More than 4 times per year    Active Member of Clubs or Organizations: No    Attends Banker Meetings: Not on file    Marital Status: Never married  Intimate Partner Violence: Not At Risk (07/25/2024)   Humiliation, Afraid, Rape, and Kick questionnaire    Fear of Current or  Ex-Partner: No    Emotionally Abused: No    Physically Abused: No    Sexually Abused: No    Family History  Problem Relation Age of Onset   Diabetes Maternal Grandfather    Diabetes Paternal Grandfather    Healthy Mother    Healthy Father    Healthy Sister    Allergic rhinitis Neg Hx    Asthma Neg Hx    Eczema Neg Hx    Urticaria Neg Hx     Past Surgical History:  Procedure Laterality Date   APPENDECTOMY  10/31/2003   CENTRAL LINE INSERTION Right 10/31/2003   INGUINAL HERNIA REPAIR Left 03/30/2004   LAPAROSCOPY N/A 11/11/2016   Procedure: LAPAROSCOPY DIAGNOSTIC  AND EXPLORATORY LYSIS OF ADHESION;  Surgeon: Julietta Millman, MD;  Location: [facility];  Service: General;  Laterality: N/A;   SEPTOPLASTY  03/2018   SEPTOPLASTY N/A 09/25/2018   Procedure: REVISION SEPTOPLASTY;  Surgeon: Karis Clunes, MD;  Location: [facility];  Service: ENT;  Laterality: N/A;    ROS: Review of Systems Negative except as stated above  PHYSICAL EXAM: BP 101/66   Pulse 73   Temp 97.9 F (36.6 C) (Oral)   Resp 16   Ht 5' 8 (1.727 m)   Wt 144 lb 12.8 oz (65.7 kg)   SpO2 97%   BMI 22.02 kg/m   Physical Exam  General: well-appearing, no acute distress Skin: no jaundice, rashes, or lesions Head: normocephalic, no lesions, no abnormal hair distribution or loss Eyes: anicteric sclera, pupils equally round and reactive to light and accommodation, extraocular movements intact Ears: no external lesions, tympanic membrane translucent  Nose: no septal deviation, turbinates clear Throat: trachea midline, no thyromegaly, moist mucus membranes Cardiovascular: regular heart rate and rhythm, normal S1/S2, no murmurs, gallops, or rubs, peripheral pulses 2+ bilaterally Chest: no skeletal deformity, lungs clear to auscultation bilaterally, equal breath sounds bilaterally Abdomen: soft, non-distended, tender to palpation RLQ, no rebound tenderness, no hepatomegaly, no splenomegaly, normoactive bowel  sounds. Non-tender  GU: deferred  Musculoskeletal: strength of upper and lower extremities equal bilaterally, 5/5, normal gait Extremities: no peripheral edema, nails intact Neurologic: cranial nerves II-XII intact    ASSESSMENT AND PLAN:  1. Encounter for annual wellness visit (Primary) - Complete physical exam performed today, no abnormal findings. - Routine labs to screen for heme, cardiovascular and metabolic disorders.  2. Thyroid  disorder screen - TSH  3. Encounter for lipid screening for cardiovascular disease - Lipid panel  4. Screening for blood disease - CBC  5. Screening for metabolic disorder - Comprehensive metabolic panel with GFR    Patient was given the opportunity to ask questions.  Patient verbalized understanding of the plan and was able to repeat key elements of the plan.    Orders Placed This Encounter  Procedures   CBC   Comprehensive metabolic panel with GFR  Lipid panel   TSH     Return in about 1 month (around 09/26/2024) for follow-up.  Sula Leavy Rode, PA-C

## 2024-08-28 LAB — LIPID PANEL
Chol/HDL Ratio: 2.9 ratio (ref 0.0–5.0)
Cholesterol, Total: 163 mg/dL (ref 100–199)
HDL: 57 mg/dL (ref >39)
LDL Chol Calc (NIH): 88 mg/dL (ref 0–99)
Triglycerides: 98 mg/dL (ref 0–149)
VLDL Cholesterol Cal: 18 mg/dL (ref 5–40)

## 2024-08-28 LAB — COMPREHENSIVE METABOLIC PANEL WITH GFR
ALT: 12 IU/L (ref 0–44)
AST: 14 IU/L (ref 0–40)
Albumin: 4.7 g/dL (ref 4.3–5.2)
Alkaline Phosphatase: 100 IU/L (ref 44–121)
BUN/Creatinine Ratio: 22 — ABNORMAL HIGH (ref 9–20)
BUN: 20 mg/dL (ref 6–20)
Bilirubin Total: 1.5 mg/dL — ABNORMAL HIGH (ref 0.0–1.2)
CO2: 23 mmol/L (ref 20–29)
Calcium: 9.7 mg/dL (ref 8.7–10.2)
Chloride: 100 mmol/L (ref 96–106)
Creatinine, Ser: 0.9 mg/dL (ref 0.76–1.27)
Globulin, Total: 2.1 g/dL (ref 1.5–4.5)
Glucose: 108 mg/dL — ABNORMAL HIGH (ref 70–99)
Potassium: 4.5 mmol/L (ref 3.5–5.2)
Sodium: 139 mmol/L (ref 134–144)
Total Protein: 6.8 g/dL (ref 6.0–8.5)
eGFR: 123 mL/min/1.73 (ref >59)

## 2024-08-28 LAB — CBC
Hematocrit: 49.9 % (ref 37.5–51.0)
Hemoglobin: 16.5 g/dL (ref 13.0–17.7)
MCH: 31.1 pg (ref 26.6–33.0)
MCHC: 33.1 g/dL (ref 31.5–35.7)
MCV: 94 fL (ref 79–97)
Platelets: 234 x10E3/uL (ref 150–450)
RBC: 5.31 x10E6/uL (ref 4.14–5.80)
RDW: 12.1 % (ref 11.6–15.4)
WBC: 8 x10E3/uL (ref 3.4–10.8)

## 2024-08-28 LAB — TSH: TSH: 1.28 u[IU]/mL (ref 0.450–4.500)

## 2024-08-29 ENCOUNTER — Encounter: Payer: Self-pay | Admitting: Gastroenterology

## 2024-08-29 ENCOUNTER — Ambulatory Visit: Payer: Self-pay

## 2024-08-29 ENCOUNTER — Emergency Department (HOSPITAL_COMMUNITY)
Admission: EM | Admit: 2024-08-29 | Discharge: 2024-08-30 | Disposition: A | Discharge status: Home / Self Care | Attending: Emergency Medicine | Admitting: Emergency Medicine

## 2024-08-29 ENCOUNTER — Other Ambulatory Visit: Payer: Self-pay

## 2024-08-29 DIAGNOSIS — R634 Abnormal weight loss: Secondary | ICD-10-CM | POA: Insufficient documentation

## 2024-08-29 DIAGNOSIS — T18128A Food in esophagus causing other injury, initial encounter: Secondary | ICD-10-CM | POA: Diagnosis not present

## 2024-08-29 DIAGNOSIS — K222 Esophageal obstruction: Secondary | ICD-10-CM | POA: Diagnosis not present

## 2024-08-29 DIAGNOSIS — F458 Other somatoform disorders: Secondary | ICD-10-CM | POA: Insufficient documentation

## 2024-08-29 DIAGNOSIS — R09A2 Foreign body sensation, throat: Secondary | ICD-10-CM

## 2024-08-29 DIAGNOSIS — X58XXXA Exposure to other specified factors, initial encounter: Secondary | ICD-10-CM | POA: Insufficient documentation

## 2024-08-29 NOTE — Progress Notes (Signed)
 I wanted to go over your test results:  TSH which looks at your thyroid  levels is within Normal range.  Comprehensive metabolic panel which looks at your electrolytes, kidney and liver health shows all values within Normal ranges. CBC which looks at your blood count shows all values within Normal ranges. No signs of infection.  Lipid Panel which looks at your cholesterol shows all values within Normal ranges.

## 2024-08-29 NOTE — ED Triage Notes (Signed)
 Pt  reports eating food and then feeling like it is stuck in his throat. Pt reports feeling like after drinking he cleared some of it. Pt reports that he has had a similar problem before. Pt reports nausea at this time.

## 2024-08-30 ENCOUNTER — Emergency Department (HOSPITAL_COMMUNITY)

## 2024-08-30 ENCOUNTER — Telehealth: Payer: Self-pay | Admitting: Gastroenterology

## 2024-08-30 LAB — CBC WITH DIFFERENTIAL/PLATELET
Abs Immature Granulocytes: 0.01 K/uL (ref 0.00–0.07)
Basophils Absolute: 0.1 K/uL (ref 0.0–0.1)
Basophils Relative: 1 %
Eosinophils Absolute: 0.1 K/uL (ref 0.0–0.5)
Eosinophils Relative: 1 %
HCT: 47.7 % (ref 39.0–52.0)
Hemoglobin: 16.4 g/dL (ref 13.0–17.0)
Immature Granulocytes: 0 %
Lymphocytes Relative: 40 %
Lymphs Abs: 3 K/uL (ref 0.7–4.0)
MCH: 31.1 pg (ref 26.0–34.0)
MCHC: 34.4 g/dL (ref 30.0–36.0)
MCV: 90.5 fL (ref 80.0–100.0)
Monocytes Absolute: 0.3 K/uL (ref 0.1–1.0)
Monocytes Relative: 5 %
Neutro Abs: 3.9 K/uL (ref 1.7–7.7)
Neutrophils Relative %: 53 %
Platelets: 224 K/uL (ref 150–400)
RBC: 5.27 MIL/uL (ref 4.22–5.81)
RDW: 11.9 % (ref 11.5–15.5)
WBC: 7.4 K/uL (ref 4.0–10.5)
nRBC: 0 % (ref 0.0–0.2)

## 2024-08-30 LAB — BASIC METABOLIC PANEL WITH GFR
Anion gap: 10 (ref 5–15)
BUN: 11 mg/dL (ref 6–20)
CO2: 28 mmol/L (ref 22–32)
Calcium: 9.7 mg/dL (ref 8.9–10.3)
Chloride: 101 mmol/L (ref 98–111)
Creatinine, Ser: 1.04 mg/dL (ref 0.61–1.24)
GFR, Estimated: >60 mL/min (ref >60)
Glucose, Bld: 95 mg/dL (ref 70–99)
Potassium: 4.1 mmol/L (ref 3.5–5.1)
Sodium: 139 mmol/L (ref 135–145)

## 2024-08-30 MED ORDER — METOCLOPRAMIDE HCL 5 MG/ML IJ SOLN
10.0000 mg | Freq: Once | INTRAMUSCULAR | Status: AC
  Administered 2024-08-30: 10 mg via INTRAVENOUS
  Filled 2024-08-30: qty 2

## 2024-08-30 MED ORDER — GLUCAGON HCL RDNA (DIAGNOSTIC) 1 MG IJ SOLR
1.0000 mg | Freq: Once | INTRAMUSCULAR | Status: AC
  Administered 2024-08-30: 1 mg via INTRAVENOUS
  Filled 2024-08-30: qty 1

## 2024-08-30 MED ORDER — ONDANSETRON HCL 4 MG/2ML IJ SOLN
4.0000 mg | Freq: Once | INTRAMUSCULAR | Status: DC
  Filled 2024-08-30: qty 2

## 2024-08-30 MED ORDER — METOCLOPRAMIDE HCL 10 MG PO TABS: 10.0000 mg | ORAL_TABLET | Freq: Four times a day (QID) | ORAL | 0 refills | Status: DC

## 2024-08-30 NOTE — Telephone Encounter (Signed)
 Noted

## 2024-08-30 NOTE — Telephone Encounter (Signed)
 Inbound call from patient requesting a call back from the nurse to discuss if there is anyway he can get his endo and colon moved up from 9/16 at 12:30 with Dr. Abran. Patient states he was seen in the ER and they advised him to be seen for procedures as soon as possible. Please advise.

## 2024-08-30 NOTE — Telephone Encounter (Signed)
 I spoke with the pt and made him aware that 9/16 is the first available appt for procedure. He is doing well at this time and states he will avoid tough or  hard to swallow foods. He will stay on soft foods for now. He will call back if he has any trouble swallowing liquids or swallowing worsens.  He will keep the appt as planned for now. He is aware that if he has any worse concerns or trouble swallowing or breathing he will go back to the ED I will send to [place] as well to review.

## 2024-08-30 NOTE — ED Provider Notes (Signed)
 [facility] Provider Note   CSN: [id number] Arrival date & time: 08/29/24  1935     History Chief Complaint  Patient presents with   food bolus    HPI Gerald Neal is a 23 y.o. male presenting for chief complaint of concern for food bolus. Hx of difficulty swallowing and has had pain in his abdomen over the past few months. Was eating today and felt that it got stuck in his mid chest Still feels some pressure in his chest Tried to swallow some water  but unable to keep it down About 12 hours PTA he started having pain and nausea.   Patient's recorded medical, surgical, social, medication list and allergies were reviewed in the Snapshot window as part of the initial history.   Review of Systems   Review of Systems  Constitutional:  Negative for chills and fever.  HENT:  Negative for ear pain and sore throat.   Eyes:  Negative for pain and visual disturbance.  Respiratory:  Negative for cough and shortness of breath.   Cardiovascular:  Negative for chest pain and palpitations.  Gastrointestinal:  Positive for abdominal pain and nausea. Negative for vomiting.  Genitourinary:  Negative for dysuria and hematuria.  Musculoskeletal:  Negative for arthralgias and back pain.  Skin:  Negative for color change and rash.  Neurological:  Negative for seizures and syncope.  All other systems reviewed and are negative.   Physical Exam Updated Vital Signs BP (!) 98/56   Pulse 65   Temp 98.6 F (37 C) (Oral)   Resp 20   SpO2 100%  Physical Exam Vitals and nursing note reviewed.  Constitutional:      General: He is not in acute distress.    Appearance: He is well-developed.  HENT:     Head: Normocephalic and atraumatic.  Eyes:     Conjunctiva/sclera: Conjunctivae normal.  Cardiovascular:     Rate and Rhythm: Normal rate and regular rhythm.     Heart sounds: No murmur heard. Pulmonary:     Effort: Pulmonary effort is  normal. No respiratory distress.     Breath sounds: Normal breath sounds.  Abdominal:     Palpations: Abdomen is soft.     Tenderness: There is no abdominal tenderness.  Musculoskeletal:        General: No swelling.     Cervical back: Neck supple.  Skin:    General: Skin is warm and dry.     Capillary Refill: Capillary refill takes less than 2 seconds.  Neurological:     Mental Status: He is alert.  Psychiatric:        Mood and Affect: Mood normal.      ED Course/ Medical Decision Making/ A&P    Procedures Procedures   Medications Ordered in ED Medications  ondansetron  (ZOFRAN ) injection 4 mg (has no administration in time range)  glucagon  (human recombinant) (GLUCAGEN ) injection 1 mg (1 mg Intravenous Given 08/30/24 0347)  metoCLOPramide  (REGLAN ) injection 10 mg (10 mg Intravenous Given 08/30/24 0452)    Medical Decision Making:   Gerald Neal is a 23 y.o. male who presented to the ED today with abdominal pain/globus detailed above.    Patient placed on continuous vitals and telemetry monitoring while in ED which was reviewed periodically.  Complete initial physical exam performed, notably the patient  was HDS in NAD.    Reviewed and confirmed nursing documentation for past medical history, family history, social history.  Initial Assessment:   With the patient's presentation of difficulty swallowing, most likely diagnosis is achalasia vs food bolus impaction. Other diagnoses were considered including (but not limited to) perforation. These are considered less likely due to history of present illness and physical exam findings.   This is most consistent with an acute life/limb threatening illness complicated by underlying chronic conditions.  Initial Plan:  Screening labs including CBC and Metabolic panel to evaluate for infectious or metabolic etiology of disease.  Urinalysis with reflex culture ordered to evaluate for UTI or relevant urologic/nephrologic  pathology.  CXR to evaluate for structural/infectious intrathoracic pathology.  Objective evaluation as below reviewed   Initial Study Results:   Laboratory  All laboratory results reviewed without evidence of clinically relevant pathology.   Radiology:  All images reviewed independently. Agree with radiology report at this time.   DG Chest Portable 1 View Result Date: 08/30/2024 EXAM: 1 VIEW XRAY OF THE CHEST 08/30/2024 03:49:43 AM COMPARISON: PA and lateral radiographs of the chest dated 12/05/2007. CLINICAL HISTORY: Chest discomfort. Pt reports eating food and then feeling like it is stuck in his throat. Pt reports feeling like after drinking he cleared some of it. Pt reports that he has had a similar problem before. Pt reports nausea at this time. FINDINGS: LUNGS AND PLEURA: No focal pulmonary opacity. No pulmonary edema. No pleural effusion. No pneumothorax. HEART AND MEDIASTINUM: No acute abnormality of the cardiac and mediastinal silhouettes. BONES AND SOFT TISSUES: No acute osseous abnormality. IMPRESSION: 1. No acute process. Electronically signed by: Evalene Coho MD 08/30/2024 04:38 AM EDT RP Workstation: [id number]     Reassessment and Plan:   Patient symptoms resolved with Reglan  and glucagon .  He is now able to tolerate liquids and soft foods.  P.o. challenge performed in ER well-tolerated. Message sent to his primary gastroenterologist for close follow-up.  Given restoration of normal p.o. tolerance, I believe patient can be safely followed up in the outpatient setting.  He is referred back to his primary GI with Reglan  in the interim. He was directly educated on importance of returning with any onset of chest pain or development of p.o. intolerance and expressed understanding of all return precautions. Disposition:  I have considered need for hospitalization, however, considering all of the above, I believe this patient is stable for discharge at this time.  Patient/family  educated about specific return precautions for given chief complaint and symptoms.  Patient/family educated about follow-up with [facility].     Patient/family expressed understanding of return precautions and need for follow-up. Patient spoken to regarding all imaging and laboratory results and appropriate follow up for these results. All education provided in verbal form with additional information in written form. Time was allowed for answering of patient questions. Patient discharged.    Emergency Department Medication Summary:   Medications  ondansetron  (ZOFRAN ) injection 4 mg (has no administration in time range)  glucagon  (human recombinant) (GLUCAGEN ) injection 1 mg (1 mg Intravenous Given 08/30/24 0347)  metoCLOPramide  (REGLAN ) injection 10 mg (10 mg Intravenous Given 08/30/24 0452)         Clinical Impression:  1. Esophageal obstruction due to food impaction   2. Globus syndrome      Discharge   Final Clinical Impression(s) / ED Diagnoses Final diagnoses:  Esophageal obstruction due to food impaction  Globus syndrome    Rx / DC Orders ED Discharge Orders          Ordered    metoCLOPramide  (REGLAN ) 10 MG tablet  Every 6 hours        08/30/24 0649              Jerral Meth, MD 08/30/24 [phone]

## 2024-09-03 NOTE — Progress Notes (Signed)
 Reviewed. Harlene, Please obtain the CT scan report ASAP and share the results with me PRIOR to the EGD that you set him up for with me.  Thanks JP

## 2024-09-06 ENCOUNTER — Telehealth: Payer: Self-pay | Admitting: Gastroenterology

## 2024-09-06 NOTE — Telephone Encounter (Signed)
 Inbound call from patient requesting for the nurse to return his call in regards to his referral for [facility]. Patient stated that he call [facility] and was told that they don't have a referral for him. Patient is requesting we send another one over to them and us  to to give him a call informing him that it has been sent over. Please advise.

## 2024-09-06 NOTE — Addendum Note (Signed)
 Addended by: WILL POWELL CROME on: 09/06/2024 04:44 PM   Modules accepted: Orders

## 2024-09-09 NOTE — Telephone Encounter (Signed)
 Delayed post refaxed referral on 09/06/24.

## 2024-09-10 ENCOUNTER — Ambulatory Visit (AMBULATORY_SURGERY_CENTER): Admitting: Internal Medicine

## 2024-09-10 ENCOUNTER — Encounter: Payer: Self-pay | Admitting: Internal Medicine

## 2024-09-10 VITALS — BP 103/40 | HR 68 | Temp 98.2°F | Resp 9 | Ht 67.0 in | Wt 141.0 lb

## 2024-09-10 DIAGNOSIS — R933 Abnormal findings on diagnostic imaging of other parts of digestive tract: Secondary | ICD-10-CM | POA: Diagnosis not present

## 2024-09-10 DIAGNOSIS — K219 Gastro-esophageal reflux disease without esophagitis: Secondary | ICD-10-CM | POA: Diagnosis not present

## 2024-09-10 DIAGNOSIS — R1310 Dysphagia, unspecified: Secondary | ICD-10-CM | POA: Diagnosis not present

## 2024-09-10 DIAGNOSIS — R1012 Left upper quadrant pain: Secondary | ICD-10-CM | POA: Diagnosis not present

## 2024-09-10 DIAGNOSIS — R634 Abnormal weight loss: Secondary | ICD-10-CM | POA: Diagnosis not present

## 2024-09-10 DIAGNOSIS — R9389 Abnormal findings on diagnostic imaging of other specified body structures: Secondary | ICD-10-CM

## 2024-09-10 MED ORDER — SODIUM CHLORIDE 0.9 % IV SOLN: 500.0000 mL | Freq: Once | INTRAVENOUS | Status: DC

## 2024-09-10 NOTE — Op Note (Signed)
 [facility] Patient Name: Gerald Neal Procedure Date: 09/10/2024 12:46 PM MRN: [id number] Endoscopist: Norleen SAILOR. Abran , MD, [id number] Age: 23 Referring MD:  Date of Birth: Jun 02, 2001 Gender: Male Account #: [id number] Procedure:                Upper GI endoscopy Indications:              Abdominal pain in the left upper quadrant,                            Odynophagia (after eating a tater tot in May),                            Esophageal reflux (on no medical therapy), Weight                            loss Medicines:                Monitored Anesthesia Care Procedure:                Pre-Anesthesia Assessment:                           - Prior to the procedure, a History and Physical                            was performed, and patient medications and                            allergies were reviewed. The patient's tolerance of                            previous anesthesia was also reviewed. The risks                            and benefits of the procedure and the sedation                            options and risks were discussed with the patient.                            All questions were answered, and informed consent                            was obtained. Prior Anticoagulants: The patient has                            taken no anticoagulant or antiplatelet agents. ASA                            Grade Assessment: I - A normal, healthy patient.                            After reviewing the risks and benefits, the patient  was deemed in satisfactory condition to undergo the                            procedure.                           After obtaining informed consent, the endoscope was                            passed under direct vision. Throughout the                            procedure, the patient's blood pressure, pulse, and                            oxygen saturations were monitored continuously. The                             Olympus Scope [id number] was introduced through the                            mouth, and advanced to the second part of duodenum.                            The upper GI endoscopy was accomplished without                            difficulty. The patient tolerated the procedure                            well. Scope In: Scope Out: Findings:                 The esophagus was normal.                           The stomach was normal.                           The examined duodenum was normal.                           The cardia and gastric fundus were normal on                            retroflexion. Complications:            No immediate complications. Estimated Blood Loss:     Estimated blood loss: none. Impression:               - Normal esophagus.                           - Normal stomach.                           - Normal examined duodenum.                           -  No specimens collected. Recommendation:           - Patient has a contact number available for                            emergencies. The signs and symptoms of potential                            delayed complications were discussed with the                            patient. Return to normal activities tomorrow.                            Written discharge instructions were provided to the                            patient.                           - Resume previous diet.                           - Continue present medications.                           - Try Prilosec OTC 20 mg daily for several weeks to                            see if this helps with acid reflux symptoms                           - Return to the care of your primary provider Norleen SAILOR. Abran, MD 09/10/2024 1:47:10 PM This report has been signed electronically.

## 2024-09-10 NOTE — Progress Notes (Signed)
 Expand All Collapse All        08/22/2024 Gerald Neal [id number] 2001-11-30     HISTORY OF PRESENT ILLNESS: This is a 23 year old male who is new to our office.  He is here today primarily with complaints of painful and difficulty swallowing.  He tells me that back at the end of May he ate a tater tot.  He says very dry it felt like it got stuck as it went down his esophagus.  He says that since then he has continued to have issues with swallowing.  Everything hurts to go down and it feels like something is blocking his throat.  Reports having pain right up under his left rib cage and feels raw in that area.  He reports about a 20 pound weight loss since that time.   While he is here he also reports that he was recently in [place]  for work and was seen there at [facility] and a CT scan of the abdomen and pelvis that showed thickening in his colon.  He denies any diarrhea.  He does report intermittent rectal pain.  He says that was in he was in college experimented with homosexual activity where he received anal intercourse.  He wants to make sure he did not cause any issues with doing that.  No longer participates in the type of intercourse.   He is also concerned that he has a right inguinal hernia.  Had one repaired on the left as a young child.   Has a history of appendectomy and inguinal hernia repair.  He also then had a laparoscopy with lysis of adhesions in 2017 for a small bowel obstruction (had an adhesive band).  No recurrence since that time.   Recent TSH, CMP, CBC all normal.  Total bili 1.5, it looks like this has always been elevated with a max of 2.2 dating back 7 years ago.  Other LFTs normal.         Past Medical History:  Diagnosis Date   Acute left ankle pain 05/19/2023   Deviated septum 08/2018   Recurrent urticaria 01/21/2019   Runny nose 09/18/2018    clear drainage, per mother   Rupture of left plantaris tendon 01/11/2023   Sore throat  09/18/2018             Past Surgical History:  Procedure Laterality Date   APPENDECTOMY   10/31/2003   CENTRAL LINE INSERTION Right 10/31/2003   INGUINAL HERNIA REPAIR Left 03/30/2004   LAPAROSCOPY N/A 11/11/2016    Procedure: LAPAROSCOPY DIAGNOSTIC  AND EXPLORATORY LYSIS OF ADHESION;  Surgeon: Julietta Millman, MD;  Location: [facility] OR;  Service: General;  Laterality: N/A;   SEPTOPLASTY   03/2018   SEPTOPLASTY N/A 09/25/2018    Procedure: REVISION SEPTOPLASTY;  Surgeon: Karis Clunes, MD;  Location: [facility];  Service: ENT;  Laterality: N/A;         reports that he has never smoked. He has never used smokeless tobacco. He reports that he does not drink alcohol and does not use drugs. family history includes Diabetes in his maternal grandfather and paternal grandfather; Healthy in his father, mother, and sister. Allergies  No Known Allergies           Outpatient Encounter Medications as of 08/22/2024  Medication Sig   cetirizine  (ZYRTEC  ALLERGY ) 10 MG tablet Take 1 tablet (10 mg total) by mouth daily as needed for allergies.   rizatriptan  (MAXALT ) 10 MG tablet TAKE 1  TABLET BY MOUTH AS NEEDED FOR MIGRAINE. MAY REPEAT IN 2 HOURS IF NEEDED (Patient not taking: Reported on 08/22/2024)      No facility-administered encounter medications on file as of 08/22/2024.        REVIEW OF SYSTEMS  : All other systems reviewed and negative except where noted in the History of Present Illness.     PHYSICAL EXAM: BP 104/72   Pulse 84   Ht 5' 7 (1.702 m)   Wt 141 lb (64 kg)   BMI 22.08 kg/m  General: Well developed male in no acute distress Head: Normocephalic and atraumatic Eyes:  Sclerae anicteric, conjunctiva pink. Ears: Normal auditory acuity Lungs: Clear throughout to auscultation; no W/R/R. Heart: Regular rate and rhythm; no M/R/G. Abdomen: Soft, non-distended.  BS present.  Mild LUQ TTP under the ribcage.. Rectal:  Will be done at the time of  colonoscopy. Musculoskeletal: Symmetrical with no gross deformities  Skin: No lesions on visible extremities Extremities: No edema  Neurological: Alert oriented x 4, grossly non-focal Psychological:  Alert and cooperative. Normal mood and affect   ASSESSMENT AND PLAN: *GERD, dysphagia/odynophagia, and LUQ abdominal pain:  Describes eating a tater tot back at the end of May that seemed to go down the esophagus the wrong way and has continued pain and issues with swallowing since then.  Will plan for EGD with Dr. Abran. *Abnormal CT scan of the colon:  Reports having a CT scan in [place]  that showed inflammation in his colon.  No diarrhea but patient is concerned about this.  Also reports some rectal pain.  Will try to get results of the CT scan but will tentatively plan for colonoscopy as well. *Weight loss:  Reports 20 pound weight loss recently. *Possible right inguinal hernia:  Patient very concerned about hernia.  Will refer to [facility] for evaluation. *Elevated total bilirubin: Currently 1.5.   It looks like this has always been elevated with a max of 2.2 dating back 7 years ago.  Other LFTs normal.  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   **The risks, benefits, and alternatives to colonoscopy were discussed with the patient and he consents to proceed.    CC:  Leavy Lucas Fox, [place]*        ADDENDUM: Immediately post procedure, nursing was able to retrieve the patient's CT report from June 27, 2024 from [facility] [place]  [facility].  Impressions were as follows: 1.  Diffuse bladder wall thickening suspicious for cystitis 2.  Mild rectal wall thickening, likely infectious or inflammatory

## 2024-09-10 NOTE — Op Note (Signed)
 [facility] Patient Name: Gerald Neal Procedure Date: 09/10/2024 12:46 PM MRN: [id number] Endoscopist: Norleen SAILOR. Abran , MD, [id number] Age: 23 Referring MD:  Date of Birth: 2001-04-16 Gender: Male Account #: [id number] Procedure:                Colonoscopy Indications:              Abdominal pain in the left lower quadrant, Abnormal                            CT of the GI tract (colonic thickening on CT scan                            in [place] . It was recommended that he have                            colonoscopy to follow-up on this finding), Weight                            loss Medicines:                Monitored Anesthesia Care Procedure:                Pre-Anesthesia Assessment:                           - Prior to the procedure, a History and Physical                            was performed, and patient medications and                            allergies were reviewed. The patient's tolerance of                            previous anesthesia was also reviewed. The risks                            and benefits of the procedure and the sedation                            options and risks were discussed with the patient.                            All questions were answered, and informed consent                            was obtained. Prior Anticoagulants: The patient has                            taken no anticoagulant or antiplatelet agents. ASA                            Grade Assessment: I - A normal, healthy patient.  After reviewing the risks and benefits, the patient                            was deemed in satisfactory condition to undergo the                            procedure.                           After obtaining informed consent, the colonoscope                            was passed under direct vision. Throughout the                            procedure, the patient's blood pressure, pulse, and                             oxygen saturations were monitored continuously. The                            CF [id number] #[id number] was introduced through the anus                            and advanced to the the cecum, identified by                            appendiceal orifice and ileocecal valve. The                            terminal ileum, ileocecal valve, appendiceal                            orifice, and rectum were photographed. The quality                            of the bowel preparation was excellent. The                            colonoscopy was performed without difficulty. The                            patient tolerated the procedure well. The bowel                            preparation used was SUPREP via split dose                            instruction. Scope In: 1:21:21 PM Scope Out: 1:32:42 PM Scope Withdrawal Time: 0 hours 7 minutes 57 seconds  Total Procedure Duration: 0 hours 11 minutes 21 seconds  Findings:                 The terminal ileum appeared normal.  The entire examined colon appeared normal on direct                            and retroflexion views. Complications:            No immediate complications. Estimated blood loss:                            None. Estimated Blood Loss:     Estimated blood loss: none. Impression:               - The examined portion of the ileum was normal.                           - The entire examined colon is normal on direct and                            retroflexion views.                           - No specimens collected. Recommendation:           - Repeat colonoscopy at age 48 for screening                            purposes.                           - Patient has a contact number available for                            emergencies. The signs and symptoms of potential                            delayed complications were discussed with the                            patient. Return to normal activities  tomorrow.                            Written discharge instructions were provided to the                            patient.                           - Resume previous diet.                           - Continue present medications.                           - EGD today. Please see report Norleen SAILOR. Abran, MD 09/10/2024 1:43:04 PM This report has been signed electronically.

## 2024-09-10 NOTE — Progress Notes (Signed)
 Report to PACU, RN, vss, BBS= Clear.

## 2024-09-10 NOTE — Patient Instructions (Signed)
 Please read handouts provided. Continue present medications. Try Prilosec OTC 20 mg daily for several weeks to see if this helps with acid reflux symptoms. Return to care of your primary provider. Resume previous diet. Repeat colonoscopy at age 23 for screening.  YOU HAD AN ENDOSCOPIC PROCEDURE TODAY AT THE [facility]:   Refer to the procedure report that was given to you for any specific questions about what was found during the examination.  If the procedure report does not answer your questions, please call your gastroenterologist to clarify.  If you requested that your care partner not be given the details of your procedure findings, then the procedure report has been included in a sealed envelope for you to review at your convenience later.  YOU SHOULD EXPECT: Some feelings of bloating in the abdomen. Passage of more gas than usual.  Walking can help get rid of the air that was put into your GI tract during the procedure and reduce the bloating. If you had a lower endoscopy (such as a colonoscopy or flexible sigmoidoscopy) you may notice spotting of blood in your stool or on the toilet paper. If you underwent a bowel prep for your procedure, you may not have a normal bowel movement for a few days.  Please Note:  You might notice some irritation and congestion in your nose or some drainage.  This is from the oxygen used during your procedure.  There is no need for concern and it should clear up in a day or so.  SYMPTOMS TO REPORT IMMEDIATELY:  Following lower endoscopy (colonoscopy or flexible sigmoidoscopy):  Excessive amounts of blood in the stool  Significant tenderness or worsening of abdominal pains  Swelling of the abdomen that is new, acute  Fever of 100F or higher  Following upper endoscopy (EGD)  Vomiting of blood or coffee ground material  New chest pain or pain under the shoulder blades  Painful or persistently difficult swallowing  New shortness of  breath  Fever of 100F or higher  Black, tarry-looking stools  For urgent or emergent issues, a gastroenterologist can be reached at any hour by calling [phone]. Do not use MyChart messaging for urgent concerns.    DIET:  We do recommend a small meal at first, but then you may proceed to your regular diet.  Drink plenty of fluids but you should avoid alcoholic beverages for 24 hours.  ACTIVITY:  You should plan to take it easy for the rest of today and you should NOT DRIVE or use heavy machinery until tomorrow (because of the sedation medicines used during the test).    FOLLOW UP: Our staff will call the number listed on your records the next business day following your procedure.  We will call around 7:15- 8:00 am to check on you and address any questions or concerns that you may have regarding the information given to you following your procedure. If we do not reach you, we will leave a message.     If any biopsies were taken you will be contacted by phone or by letter within the next 1-3 weeks.  Please call us  at [phone] if you have not heard about the biopsies in 3 weeks.    SIGNATURES/CONFIDENTIALITY: You and/or your care partner have signed paperwork which will be entered into your electronic medical record.  These signatures attest to the fact that that the information above on your After Visit Summary has been reviewed and is understood.  Full responsibility of  the confidentiality of this discharge information lies with you and/or your care-partner.

## 2024-09-11 ENCOUNTER — Telehealth: Payer: Self-pay

## 2024-09-11 NOTE — Telephone Encounter (Signed)
  Follow up Call-     09/10/2024   12:44 PM  Call back number  Post procedure Call Back phone  # [phone]  Permission to leave phone message Yes     Patient questions:  Do you have a fever, pain , or abdominal swelling? No. Pain Score  0 *  Have you tolerated food without any problems? Yes.    Have you been able to return to your normal activities? Yes.    Do you have any questions about your discharge instructions: Diet   No. Medications  No. Follow up visit  No.  Do you have questions or concerns about your Care? No.  Actions: * If pain score is 4 or above: No action needed, pain <4.

## 2024-09-16 DIAGNOSIS — K4090 Unilateral inguinal hernia, without obstruction or gangrene, not specified as recurrent: Secondary | ICD-10-CM | POA: Diagnosis not present

## 2024-09-16 NOTE — Progress Notes (Signed)
 REFERRING PROVIDER: Cayetano Raisin D, PA  REASON FOR CONSULTATION: Right inguinal hernia.  HISTORY OF PRESENT ILLNESS: Gerald Neal is a 23 y.o. male who is referred by Dr. Zehr for evaluation of a right inguinal hernia. The patient first noted his symptoms several months ago which included a bulge and discomfort. The bulge is made worse with straining, physical activity, and standing. It lessens when laying flat. The patient also describes a moderate amount of discomfort in the area of the bulge-this is also worse with activity and straining. He denies issues with urination or constipation. The patient also denies nausea, vomiting, fevers, or chills associated with an acutely worsening bulge.  He has a history of prior open left inguinal hernia repair as an young child as well as open appendectomy and lap LOA in 2017.   PROBLEM LIST: There is no problem list on file for this patient.  PAST MEDICAL HISTORY: Past Medical History:  Diagnosis Date  . Deviated septum   . GERD (gastroesophageal reflux disease)   . Recurrent urticaria   . Rupture of left plantaris tendon    PAST SURGICAL HISTORY: Past Surgical History:  Procedure Laterality Date  . INSERTION TUNNELED CENTRAL LINE  10/31/2003  . APPENDECTOMY  10/31/2003  . INGUINAL HERNIA REPAIR Left 03/30/2004  . SEPTOPLASTY  03/2018  . SEPTOPLASTY  09/25/2018   MEDICATIONS:  Current Outpatient Medications:  .  cetirizine  (ZYRTEC ) 10 MG tablet, Take 10 mg by mouth once daily as needed for Allergies, Disp: , Rfl:  .  metoclopramide  (REGLAN ) 10 MG tablet, Take 10 mg by mouth every 6 (six) hours, Disp: , Rfl:  .  rizatriptan  (MAXALT ) 10 MG tablet, TAKE 1 TABLET BY MOUTH AS NEEDED FOR MIGRAINE. MAY REPEAT IN 2 HOURS IF NEEDED, Disp: , Rfl:  ALLERGIES: No Known Allergies SOCIAL HISTORY: Social History   Socioeconomic History  . Marital status: Single  Tobacco Use  . Smoking status: Former    Types: Cigarettes  .  Smokeless tobacco: Never  Substance and Sexual Activity  . Drug use: Never   Social Drivers of Corporate investment banker Strain: High Risk (08/27/2024)   Received from [facility]   Overall Financial Resource Strain (CARDIA)   . How hard is it for you to pay for the very basics like food, housing, medical care, and heating?: Very hard  Food Insecurity: Patient Declined (08/27/2024)   Received from [facility]   Hunger Vital Sign   . Within the past 12 months, you worried that your food would run out before you got the money to buy more.: Patient declined   . Within the past 12 months, the food you bought just didn't last and you didn't have money to get more.: Patient declined  Transportation Needs: No Transportation Needs (08/27/2024)   Received from [facility] - Transportation   . In the past 12 months, has lack of transportation kept you from medical appointments or from getting medications?: No   . In the past 12 months, has lack of transportation kept you from meetings, work, or from getting things needed for daily living?: No  Physical Activity: Insufficiently Active (08/27/2024)   Received from [facility]   Exercise Vital Sign   . On average, how many days per week do you engage in moderate to strenuous exercise (like a brisk walk)?: 1 day   . On average, how many minutes do you engage in exercise at this level?: 10 min  Stress:  No Stress Concern Present (08/27/2024)   Received from [facility] - Occupational Stress Questionnaire   . Do you feel stress - tense, restless, nervous, or anxious, or unable to sleep at night because your mind is troubled all the time - these days?: Only a little  Social Connections: Moderately Isolated (08/27/2024)   Received from [facility]   Social Connection and Isolation Panel   . In a typical week, how many times do you talk on the phone with family, friends, or neighbors?: More than three times a  week   . How often do you get together with friends or relatives?: Three times a week   . How often do you attend church or religious services?: More than 4 times per year   . Do you belong to any clubs or organizations such as church groups, unions, fraternal or athletic groups, or school groups?: No   . Are you married, widowed, divorced, separated, never married, or living with a partner?: Never married   FAMILY HISTORY: Family History  Problem Relation Name Age of Onset  . Diabetes Maternal Grandfather    . Diabetes Paternal Grandfather    . Allergic rhinitis Neg Hx    . Asthma Neg Hx    . Eczema Neg Hx    . Urticaria Neg Hx      REVIEW OF SYSTEMS: Constitutional: Negative for activity change, appetite change and unexpected weight change. HENT: Negative for congestion, dental problem and sneezing. Respiratory: Negative for cough, chest tightness and shortness of breath. Cardiovascular: Negative for chest pain, palpitations and leg swelling. Gastrointestinal: Negative for diarrhea, nausea and vomiting. Genitourinary: Negative for decreased urine volume. Musculoskeletal: Negative for arthralgias, gait problem and joint swelling. Skin: Negative for color change, pallor and rash. Neurological: Negative for dizziness, facial asymmetry, speech difficulty and numbness. Hematological: Negative for adenopathy. Does not bruise/bleed easily.  Psychiatric/Behavioral: Negative for confusion.  PHYSICAL EXAMINATION: VITALS: BP 100/76   Pulse 98   Temp 36.8 C (98.2 F)   Ht 172.7 cm (5' 8)   Wt 65 kg (143 lb 6.4 oz)   SpO2 98%   BMI 21.80 kg/m  Body mass index is 21.8 kg/m. Physical exam reveals a not obese well-appearing, well-nourished Hispanic male in no apparent distress. PSYCH: Alert and oriented x4. Appropriate affect. NEURO: CN II-XII grossly intact. Coordination, mental status, and gait normal. HEAD: Normocephalic, atraumatic, no temporal wasting. EYES: EOMI, conjunctiva  clear, sclerae anicteric. ENT: Oropharynx without lesions/exudate, mucous membranes moist, nares patent without drainage. NECK: Soft and supple without JVD or thyromegaly, no masses, trachea midline. RESPIRATORY: CTAB, no adventitious sounds, moving air well, breath sounds symmetric. CARDIOVASCULAR: RRR, no M/R/G. GI / ABDOMEN: Soft, nontender and nondistended; no masses palpated. The patient has no hepatosplenomegaly. Bowel sounds are present. GU / GROIN: A small right inguinal bulge is present. Testes are normal and descended. LYMPHATIC: No cervical or supraclavicular lymphadenopathy. MUSCULOSKELETAL: No obvious deformities or joint abnormalities. EXTREMITIES: Warm and well perfused x 4. No cyanosis, clubbing, or edema. SKIN: Warm and dry, normal turgor, no concerning rashes or lesions. DIAGNOSTIC WORKUP: The patient does not have any recent lab work or radiology available for review today. No results found for: WBC, HGB, HCT, PLT No results found for: NA, K, CL, CO2, BUN, CREATININE, GLUCOSE, CALCIUM, MG  IMPRESSION AND PLAN: 1. Symptomatic right inguinal hernia - We discussed the natural history of this type of hernia as well as operative and non-operative management. I  do recommend we proceed with operative repair. We discussed both an open and robotic approach. The patient wishes to pursue a robotic approach which is reasonable. He does wish to proceed with contralateral repair should a left sided hernia be discovered. I have discussed the risks benefits and alternatives with regards to the hernia(s) and the performance of robotic inguinal hernia repair. There exists risks of incarceration and strangulation along with continued discomforts if surgery is not performed.There is no medical alternative to surgical intervention. The risks of the operation include but are not limited to bleeding, infection, recurrence of the hernia, injury to the spermatic cord / vas  deferens and chronic pain. We reviewed the use of prosthetic material and the potential benefits. I have discussed the potential need to convert from a robotic to an open approach. There are general risks of surgery including but not limited to MI, CVA, MSOF and even death. I have given the patient an opportunity to ask questions and I have answered them to the best of my ability. The patient wishes to proceed.  I discussed the small chance of incarceration or strangulation and educated the patient regarding the signs and symptoms of this, namely, fevers, chills, nausea, vomiting, worsening pain at the hernia site. I also instructed that should any of these symptoms be experienced, medical treatment should be sought immediately.  - I think that I should be able to do this robotic since they were able to complete his LOA in 2017 lap. I did discuss that if there was a lot of scar tissue that we may need to convert to an open operation.  Cordella Beverley Idler, MD  Evaluation and management of this patient required review of outside notes, review of outside labs, and an assessment with an independent historian

## 2024-09-18 ENCOUNTER — Encounter (HOSPITAL_COMMUNITY): Payer: Self-pay | Admitting: General Surgery

## 2024-09-18 ENCOUNTER — Other Ambulatory Visit: Payer: Self-pay

## 2024-09-18 NOTE — Progress Notes (Addendum)
 For Anesthesia: PCP - Sula Leavy Rode, PA-C office visit note 08/27/24 in [facility] Cardiologist - N/A Neurologist-Jaffe, Juliene SAUNDERS, DO office visit note 10/22/21   Bowel Prep reminder:N/A  Chest x-ray - 08/30/24 in [facility] EKG - N/A Stress Test - N/A ECHO -N/A  Cardiac Cath - N/A Pacemaker/ICD device last checked: N/A Pacemaker orders received: N/A Device Rep notified: N/A  Spinal Cord Stimulator: N/A  Sleep Study - N/A CPAP - N/A  Fasting Blood Sugar -N/A  Checks Blood Sugar __N/A___ times a day Date and result of last Hgb A1c-N/A  Last dose of GLP1 agonist- N/A GLP1 instructions: Hold 7 days prior to schedule (Hold 24 hours-daily)   Last dose of SGLT-2 inhibitors- N/A SGLT-2 instructions: Hold 72 hours prior to surgery  Blood Thinner Instructions:N/A Last Dose:N/A Time last taken:N/A  Aspirin Instructions:N/A Last Dose:N/A Time last taken:N/A  Activity level: Can go up a flight of stairs and activities of daily living without stopping and without chest pain and/or shortness of breath    Anesthesia review: N/A  Patient denies shortness of breath, fever, cough and chest pain at PAT appointment   Patient verbalized understanding of instructions that were reviewed over the telephone.

## 2024-09-19 ENCOUNTER — Ambulatory Visit: Admitting: Internal Medicine

## 2024-09-20 ENCOUNTER — Ambulatory Visit (HOSPITAL_COMMUNITY)
Admission: RE | Admit: 2024-09-20 | Discharge: 2024-09-20 | Disposition: A | Discharge status: Home / Self Care | Attending: General Surgery | Admitting: General Surgery

## 2024-09-20 ENCOUNTER — Encounter (HOSPITAL_COMMUNITY): Payer: Self-pay | Admitting: General Surgery

## 2024-09-20 ENCOUNTER — Other Ambulatory Visit: Payer: Self-pay

## 2024-09-20 ENCOUNTER — Ambulatory Visit (HOSPITAL_COMMUNITY): Admitting: Anesthesiology

## 2024-09-20 ENCOUNTER — Encounter (HOSPITAL_COMMUNITY)
Admission: RE | Disposition: A | Discharge status: Home / Self Care | Payer: Self-pay | Source: Home / Self Care | Attending: General Surgery

## 2024-09-20 ENCOUNTER — Ambulatory Visit (HOSPITAL_BASED_OUTPATIENT_CLINIC_OR_DEPARTMENT_OTHER): Admitting: Anesthesiology

## 2024-09-20 DIAGNOSIS — K4090 Unilateral inguinal hernia, without obstruction or gangrene, not specified as recurrent: Secondary | ICD-10-CM | POA: Diagnosis not present

## 2024-09-20 DIAGNOSIS — G8918 Other acute postprocedural pain: Secondary | ICD-10-CM | POA: Diagnosis not present

## 2024-09-20 HISTORY — PX: XI ROBOTIC ASSISTED INGUINAL HERNIA REPAIR WITH MESH: SHX6706

## 2024-09-20 HISTORY — DX: Chest pain, unspecified: R07.9

## 2024-09-20 HISTORY — DX: Unilateral inguinal hernia, without obstruction or gangrene, not specified as recurrent: K40.90

## 2024-09-20 HISTORY — DX: Fatty (change of) liver, not elsewhere classified: K76.0

## 2024-09-20 HISTORY — DX: Migraine, unspecified, not intractable, without status migrainosus: G43.909

## 2024-09-20 SURGERY — REPAIR, HERNIA, INGUINAL, ROBOT-ASSISTED, LAPAROSCOPIC, USING MESH
Anesthesia: Regional | Site: Pelvis | Laterality: Right

## 2024-09-20 MED ORDER — ONDANSETRON HCL 4 MG/2ML IJ SOLN: 4.0000 mg | Freq: Once | INTRAMUSCULAR | Status: DC | PRN

## 2024-09-20 MED ORDER — ACETAMINOPHEN 325 MG PO TABS: 650.0000 mg | ORAL_TABLET | Freq: Four times a day (QID) | ORAL | 0 refills | Status: AC

## 2024-09-20 MED ORDER — OXYCODONE HCL 5 MG PO TABS: 5.0000 mg | ORAL_TABLET | ORAL | Status: DC | PRN

## 2024-09-20 MED ORDER — OXYCODONE HCL 5 MG/5ML PO SOLN: 5.0000 mg | Freq: Once | ORAL | Status: AC | PRN

## 2024-09-20 MED ORDER — STERILE WATER FOR IRRIGATION IR SOLN
Status: DC | PRN
  Administered 2024-09-20: 1000 mL

## 2024-09-20 MED ORDER — LIDOCAINE HCL (PF) 2 % IJ SOLN
INTRAMUSCULAR | Status: AC
  Filled 2024-09-20: qty 5

## 2024-09-20 MED ORDER — PROPOFOL 10 MG/ML IV BOLUS
INTRAVENOUS | Status: DC | PRN
  Administered 2024-09-20: 150 mg via INTRAVENOUS

## 2024-09-20 MED ORDER — ORAL CARE MOUTH RINSE: 15.0000 mL | Freq: Once | OROMUCOSAL | Status: AC

## 2024-09-20 MED ORDER — HYDROMORPHONE HCL 1 MG/ML IJ SOLN
0.2500 mg | INTRAMUSCULAR | Status: DC | PRN
  Administered 2024-09-20: 0.25 mg via INTRAVENOUS

## 2024-09-20 MED ORDER — BUPIVACAINE-EPINEPHRINE (PF) 0.25% -1:200000 IJ SOLN
INTRAMUSCULAR | Status: AC
Start: 2024-09-20 — End: 2024-09-20
  Filled 2024-09-20: qty 30

## 2024-09-20 MED ORDER — LIDOCAINE HCL (CARDIAC) PF 100 MG/5ML IV SOSY
PREFILLED_SYRINGE | INTRAVENOUS | Status: DC | PRN
  Administered 2024-09-20: 60 mg via INTRAVENOUS

## 2024-09-20 MED ORDER — MIDAZOLAM HCL 2 MG/2ML IJ SOLN
1.0000 mg | Freq: Once | INTRAMUSCULAR | Status: AC
  Administered 2024-09-20: 2 mg via INTRAVENOUS
  Filled 2024-09-20: qty 2

## 2024-09-20 MED ORDER — OXYCODONE HCL 5 MG PO TABS: 5.0000 mg | ORAL_TABLET | Freq: Three times a day (TID) | ORAL | 0 refills | Status: AC | PRN

## 2024-09-20 MED ORDER — SODIUM CHLORIDE 0.9 % IV SOLN: 250.0000 mL | INTRAVENOUS | Status: DC | PRN

## 2024-09-20 MED ORDER — OXYCODONE HCL 5 MG PO TABS
ORAL_TABLET | ORAL | Status: AC
  Filled 2024-09-20: qty 1

## 2024-09-20 MED ORDER — ROCURONIUM BROMIDE 10 MG/ML (PF) SYRINGE
PREFILLED_SYRINGE | INTRAVENOUS | Status: AC
  Filled 2024-09-20: qty 10

## 2024-09-20 MED ORDER — ACETAMINOPHEN 325 MG PO TABS: 650.0000 mg | ORAL_TABLET | ORAL | Status: DC | PRN

## 2024-09-20 MED ORDER — AMISULPRIDE (ANTIEMETIC) 5 MG/2ML IV SOLN: 10.0000 mg | Freq: Once | INTRAVENOUS | Status: DC | PRN

## 2024-09-20 MED ORDER — FENTANYL CITRATE PF 50 MCG/ML IJ SOSY
50.0000 ug | PREFILLED_SYRINGE | INTRAMUSCULAR | Status: AC
  Administered 2024-09-20: 50 ug via INTRAVENOUS
  Filled 2024-09-20: qty 2

## 2024-09-20 MED ORDER — SUGAMMADEX SODIUM 200 MG/2ML IV SOLN
INTRAVENOUS | Status: AC
  Filled 2024-09-20: qty 2

## 2024-09-20 MED ORDER — CHLORHEXIDINE GLUCONATE 0.12 % MT SOLN
15.0000 mL | Freq: Once | OROMUCOSAL | Status: AC
  Administered 2024-09-20: 15 mL via OROMUCOSAL

## 2024-09-20 MED ORDER — ACETAMINOPHEN 500 MG PO TABS
1000.0000 mg | ORAL_TABLET | Freq: Once | ORAL | Status: AC
  Administered 2024-09-20: 1000 mg via ORAL
  Filled 2024-09-20: qty 2

## 2024-09-20 MED ORDER — SODIUM CHLORIDE 0.9% FLUSH: 3.0000 mL | INTRAVENOUS | Status: DC | PRN

## 2024-09-20 MED ORDER — EPHEDRINE SULFATE-NACL 50-0.9 MG/10ML-% IV SOSY
PREFILLED_SYRINGE | INTRAVENOUS | Status: DC | PRN
  Administered 2024-09-20: 5 mg via INTRAVENOUS

## 2024-09-20 MED ORDER — FENTANYL CITRATE (PF) 250 MCG/5ML IJ SOLN
INTRAMUSCULAR | Status: AC
  Filled 2024-09-20: qty 5

## 2024-09-20 MED ORDER — ROPIVACAINE HCL 5 MG/ML IJ SOLN
INTRAMUSCULAR | Status: DC | PRN
  Administered 2024-09-20: 30 mL

## 2024-09-20 MED ORDER — BUPIVACAINE-EPINEPHRINE 0.25% -1:200000 IJ SOLN
INTRAMUSCULAR | Status: DC | PRN
  Administered 2024-09-20: 17 mL

## 2024-09-20 MED ORDER — DEXAMETHASONE SODIUM PHOSPHATE 10 MG/ML IJ SOLN
INTRAMUSCULAR | Status: DC | PRN
  Administered 2024-09-20 (×2): 10 mg

## 2024-09-20 MED ORDER — ONDANSETRON HCL 4 MG/2ML IJ SOLN
INTRAMUSCULAR | Status: DC | PRN
  Administered 2024-09-20: 4 mg via INTRAVENOUS

## 2024-09-20 MED ORDER — MIDAZOLAM HCL 2 MG/2ML IJ SOLN
INTRAMUSCULAR | Status: AC
  Filled 2024-09-20: qty 2

## 2024-09-20 MED ORDER — MORPHINE SULFATE (PF) 2 MG/ML IV SOLN: 1.0000 mg | INTRAVENOUS | Status: DC | PRN

## 2024-09-20 MED ORDER — ROCURONIUM BROMIDE 100 MG/10ML IV SOLN
INTRAVENOUS | Status: DC | PRN
  Administered 2024-09-20: 6 mg via INTRAVENOUS
  Administered 2024-09-20: 5 mg via INTRAVENOUS
  Administered 2024-09-20: 10 mg via INTRAVENOUS

## 2024-09-20 MED ORDER — FENTANYL CITRATE (PF) 100 MCG/2ML IJ SOLN
INTRAMUSCULAR | Status: DC | PRN
  Administered 2024-09-20 (×4): 50 ug via INTRAVENOUS

## 2024-09-20 MED ORDER — PHENYLEPHRINE 80 MCG/ML (10ML) SYRINGE FOR IV PUSH (FOR BLOOD PRESSURE SUPPORT)
PREFILLED_SYRINGE | INTRAVENOUS | Status: DC | PRN
  Administered 2024-09-20: 120 ug via INTRAVENOUS

## 2024-09-20 MED ORDER — CEFAZOLIN SODIUM-DEXTROSE 2-3 GM-%(50ML) IV SOLR
INTRAVENOUS | Status: DC | PRN
Start: 2024-09-20 — End: 2024-09-20
  Administered 2024-09-20: 2 g via INTRAVENOUS

## 2024-09-20 MED ORDER — SUGAMMADEX SODIUM 200 MG/2ML IV SOLN
INTRAVENOUS | Status: DC | PRN
Start: 2024-09-20 — End: 2024-09-20
  Administered 2024-09-20: 200 mg via INTRAVENOUS

## 2024-09-20 MED ORDER — SODIUM CHLORIDE 0.9% FLUSH: 3.0000 mL | Freq: Two times a day (BID) | INTRAVENOUS | Status: DC

## 2024-09-20 MED ORDER — LACTATED RINGERS IV SOLN: INTRAVENOUS | Status: DC

## 2024-09-20 MED ORDER — SUGAMMADEX SODIUM 200 MG/2ML IV SOLN
INTRAVENOUS | Status: AC
Start: 2024-09-20 — End: 2024-09-20
  Filled 2024-09-20: qty 2

## 2024-09-20 MED ORDER — CEFAZOLIN SODIUM 1 G IJ SOLR
INTRAMUSCULAR | Status: AC
Start: 2024-09-20 — End: 2024-09-20
  Filled 2024-09-20: qty 20

## 2024-09-20 MED ORDER — MIDAZOLAM HCL 5 MG/5ML IJ SOLN
INTRAMUSCULAR | Status: DC | PRN
  Administered 2024-09-20: 2 mg via INTRAVENOUS

## 2024-09-20 MED ORDER — HYDROMORPHONE HCL 1 MG/ML IJ SOLN
INTRAMUSCULAR | Status: AC
  Filled 2024-09-20: qty 1

## 2024-09-20 MED ORDER — ONDANSETRON HCL 4 MG/2ML IJ SOLN
INTRAMUSCULAR | Status: AC
  Filled 2024-09-20: qty 2

## 2024-09-20 MED ORDER — DEXAMETHASONE SODIUM PHOSPHATE 10 MG/ML IJ SOLN
INTRAMUSCULAR | Status: AC
  Filled 2024-09-20: qty 1

## 2024-09-20 MED ORDER — IBUPROFEN 200 MG PO TABS: 600.0000 mg | ORAL_TABLET | Freq: Four times a day (QID) | ORAL | 0 refills | Status: AC

## 2024-09-20 MED ORDER — ACETAMINOPHEN 650 MG RE SUPP: 650.0000 mg | RECTAL | Status: DC | PRN

## 2024-09-20 MED ORDER — OXYCODONE HCL 5 MG PO TABS
5.0000 mg | ORAL_TABLET | Freq: Once | ORAL | Status: AC | PRN
  Administered 2024-09-20: 5 mg via ORAL

## 2024-09-20 SURGICAL SUPPLY — 45 items
APPLICATOR COTTON TIP 6 STRL (MISCELLANEOUS) IMPLANT
BAG COUNTER SPONGE SURGICOUNT (BAG) IMPLANT
BLADE SURG SZ11 CARB STEEL (BLADE) ×1 IMPLANT
CHLORAPREP W/TINT 26 (MISCELLANEOUS) ×1 IMPLANT
COVER MAYO STAND STRL (DRAPES) ×1 IMPLANT
COVER SURGICAL LIGHT HANDLE (MISCELLANEOUS) ×1 IMPLANT
COVER TIP SHEARS 8 DVNC (MISCELLANEOUS) ×1 IMPLANT
DERMABOND ADVANCED .7 DNX12 (GAUZE/BANDAGES/DRESSINGS) ×1 IMPLANT
DRAPE ARM DVNC X/XI (DISPOSABLE) ×3 IMPLANT
DRAPE COLUMN DVNC XI (DISPOSABLE) ×1 IMPLANT
DRIVER NDL MEGA SUTCUT DVNCXI (INSTRUMENTS) ×1 IMPLANT
DRIVER NDLE MEGA SUTCUT DVNCXI (INSTRUMENTS) ×1 IMPLANT
ELECT REM PT RETURN 15FT ADLT (MISCELLANEOUS) ×1 IMPLANT
FORCEPS BPLR 8 MD DVNC XI (FORCEP) ×1 IMPLANT
GAUZE 4X4 16PLY ~~LOC~~+RFID DBL (SPONGE) ×1 IMPLANT
GLOVE BIO SURGEON STRL SZ7 (GLOVE) ×2 IMPLANT
GOWN STRL REUS W/ TWL XL LVL3 (GOWN DISPOSABLE) ×2 IMPLANT
IRRIGATION SUCT STRKRFLW 2 WTP (MISCELLANEOUS) IMPLANT
KIT BASIN OR (CUSTOM PROCEDURE TRAY) ×1 IMPLANT
KIT TURNOVER KIT A (KITS) ×1 IMPLANT
MARKER SKIN DUAL TIP RULER LAB (MISCELLANEOUS) ×1 IMPLANT
MESH 3DMAX MID 4X6 RT LRG (Mesh General) IMPLANT
NDL HYPO 22X1.5 SAFETY MO (MISCELLANEOUS) ×1 IMPLANT
NDL INSUFFLATION 14GA 120MM (NEEDLE) ×1 IMPLANT
NEEDLE HYPO 22X1.5 SAFETY MO (MISCELLANEOUS) ×1 IMPLANT
NEEDLE INSUFFLATION 14GA 120MM (NEEDLE) ×1 IMPLANT
OBTURATOR OPTICALSTD 8 DVNC (TROCAR) ×1 IMPLANT
PACK CARDIOVASCULAR III (CUSTOM PROCEDURE TRAY) ×1 IMPLANT
SCISSORS MNPLR CVD DVNC XI (INSTRUMENTS) ×1 IMPLANT
SEAL UNIV 5-12 XI (MISCELLANEOUS) ×3 IMPLANT
SOLUTION ANTFG W/FOAM PAD STRL (MISCELLANEOUS) ×1 IMPLANT
SOLUTION ELECTROSURG ANTI STCK (MISCELLANEOUS) ×1 IMPLANT
SPIKE FLUID TRANSFER (MISCELLANEOUS) ×1 IMPLANT
SUT MNCRL AB 4-0 PS2 18 (SUTURE) ×1 IMPLANT
SUT STRATA PDS 2-0 23 CT-1 (SUTURE) IMPLANT
SUT STRATAFIX SPIRAL PDS3-0 (SUTURE) ×1 IMPLANT
SUT VIC AB 2-0 SH 27X BRD (SUTURE) IMPLANT
SUT VIC AB 3-0 SH 27X BRD (SUTURE) ×1 IMPLANT
SYR 10ML LL (SYRINGE) ×1 IMPLANT
SYR 20ML LL LF (SYRINGE) ×1 IMPLANT
TAPE STRIPS DRAPE STRL (GAUZE/BANDAGES/DRESSINGS) ×1 IMPLANT
TOWEL GREEN STERILE FF (TOWEL DISPOSABLE) ×1 IMPLANT
TOWEL OR 17X26 10 PK STRL BLUE (TOWEL DISPOSABLE) ×1 IMPLANT
TROCAR Z-THREAD OPTICAL 5X100M (TROCAR) IMPLANT
TUBING INSUFFLATION 10FT LAP (TUBING) ×1 IMPLANT

## 2024-09-20 NOTE — Anesthesia Procedure Notes (Signed)
 Procedure Name: Intubation Date/Time: 09/20/2024 2:45 PM  Performed by: Roslynn Waddell LABOR, CRNAPre-anesthesia Checklist: Patient identified, Emergency Drugs available, Suction available and Patient being monitored Patient Re-evaluated:Patient Re-evaluated prior to induction Oxygen Delivery Method: Circle System Utilized Preoxygenation: Pre-oxygenation with 100% oxygen Induction Type: IV induction Ventilation: Mask ventilation without difficulty Laryngoscope Size: Mac and 4 Grade View: Grade I Tube type: Oral Tube size: 7.5 mm Number of attempts: 1 Airway Equipment and Method: Stylet and Oral airway Placement Confirmation: ETT inserted through vocal cords under direct vision, positive ETCO2 and breath sounds checked- equal and bilateral Secured at: 23 cm Tube secured with: Tape Dental Injury: Teeth and Oropharynx as per pre-operative assessment  Comments: Atraumatic induction/intubation. Dentition and oral mucosa as per preop.

## 2024-09-20 NOTE — Op Note (Signed)
 Gerald Neal ([id number])  Operative Report   Date 09/20/2024  PREOPERATIVE DIAGNOSIS: Right Inguinal hernia  POSTOPERATIVE DIAGNOSIS: Same  PROCEDURE:  Robotic Right Inguinal Hernia Repair with Mesh (Bard 3D Midweight L Polypropylene Mesh)   SURGEON: Gerald Idler, MD  ANESTHESIA: General Anesthesia    INTRAOPERATIVE FINDINGS: Small indirect right inguinal hernia  IMPLANTS: Implant Name Type Inv. Item Serial No. Manufacturer Lot No. LRB No. Used Action  MESH 3DMAX MID 4X6 RT LRG - [id number] Mesh General MESH 3DMAX MID 4X6 RT LRG  [employer] BARD ACCESS [id number] Right 1 Implanted    ESTIMATED BLOOD LOSS: Minimal  COMPLICATIONS: None  SPECIMENS: None  OPERATIVE INDICATIONS: Pt is a 23 y.o. male who presents with a right inguinal hernia.  The patient desires definitive repair.  The procedure's risks, benefits, and alternatives were explained to the patient.  Risks, including the risks of bleeding, infection, need for mesh removal, and potential for hernia recurrence, were discussed.  The patient agreed to proceed and signed informed consent in front of a witness.   DESCRIPTION OF PROCEDURE: After preoperatively identifying the patient in holding, the patient was brought to the operating room and placed supine on the operating room table.  Both arms were tucked and padded to avoid potential nerve injury.  Sequential Compression Devices were placed bilaterally.  After induction of general anesthesia, the patient was given the appropriate perioperative antibiotics.  The abdomen was prepped and draped in a typical sterile fashion.  A JACHO approved time out, where the name of the patient, the operation, and the intended site were confirmed. The abdomen was accessed with a Veress needle via the standard drop technique in the LUQ at Palmer's point.  Insufflation was established.  An 8 mm optical port was placed under direct visualization in the RLQ.  A camera was introduced into  the abdomen, and a thorough inspection of the abdomen was performed to confirm there was no additional pathology.  Two additional 8 mm robotic ports were placed laterally under direct visualization, one superior to the umbilicus and another in the RLQ. The patient was then placed into 15-degree Trendelenburg position to facilitate examination of the bilateral inguinal spaces.  A thorough inspection of the abdomen was undertaken.  A right inguinal hernia was identified and amenable to robotic repair.       The [employer] robot was brought into the field and docked.  An 8 mm 30-degree scope was placed in the mid-abdominal port.  The peritoneum was taken down 6 cm superior to the hernia from the anterior abdominal wall, and dissection was taken inferiorly towards the hernia.  Medial to the epigastric vessels, the parietal compartment was dissected and completed to visualize the rectus muscle.  This dissection was carried down to the symphysis pubis and obturator foramen.  The retropubic space was dissected to expose at least 2 cm contralateral to the midline.  Cooper's ligament was exposed and cleared at least 2 cm below the ligament to ensure adequate space for the inferior border of the mesh.  Hesselbach's triangle was cleared, assessing for a direct hernia. There was no direct hernia present. Any herniated femoral contents were reduced as well.  Lateral to the epigastric vessels, the dissection was carried out into the visceral compartment, continuing in the true preperitoneal plane.  The indirect hernia sac was carefully reduced and separated from the cord structures with medial retraction and a combination of blunt/sharp dissection and focused cautery.  This dissection continued until the  cord structures were parietalized completely, allowing for continuous visualization of the reflected peritoneum with the line originating 2 cm below Cooper's medially and across the psoas muscle in the lateral compartment.  The  internal ring was interrogated for a cord lipoma.  Any component of cord lipoma was reduced to the retroperitoneum and seated dorsal to the preperitoneal mesh.  Having achieved a complete dissection with a critical view of the entire myopectineal orifice, a piece of Bard 3D Midweight large right-sided Polypropylene Mesh was introduced into the field.  It was centered at the iliopubic tract, with the medial side crossing the midline and the inferior edge positioned 2 cm below Cooper's ligament.  With complete coverage of the myopectineal orifice, the inferior edge of the peritoneum was posterior and inferior to the mesh.  The lateral aspect of the mesh extended 3-5 cm beyond the lateral edge of the psoas.  Cephalad retraction of the peritoneal flap did not cause lifting of the inferior mesh edge or cord structures.  The mesh was fixated using an interrupted 3-0 Vicryl suture placed to the ipsilateral Coopers ligament.  The peritoneal flap was closed with a running 3-0 Stratafix barbed suture.  Additional holes in the peritoneum closed.  Hemostasis was assured in the entirety of the abdomen.  The two lateral ports were removed under direct visualization.  The abdomen was desufflated under direct visualization.  The port sites were closed, local anesthetic was infiltrated, and Dermabond was applied.  After confirming twice that the sponge, needle, and instrument counts were correct, the procedure was terminated, the patient was extubated and transferred to the recovery room in stable condition.     DISPOSITION: Stable to PACU.   Electronically Signed By: Gerald Neal 09/20/2024 3:59 PM

## 2024-09-20 NOTE — Anesthesia Preprocedure Evaluation (Addendum)
 Anesthesia Evaluation  Patient identified by MRN, date of birth, ID band Patient awake    Reviewed: Allergy  & Precautions, Patient's Chart, lab work & pertinent test results  Airway Mallampati: II  TM Distance: >3 FB Neck ROM: Full    Dental no notable dental hx.    Pulmonary neg pulmonary ROS   Pulmonary exam normal        Cardiovascular negative cardio ROS  Rhythm:Regular Rate:Normal     Neuro/Psych  Headaches PSYCHIATRIC DISORDERS Anxiety        GI/Hepatic Neg liver ROS,GERD  Controlled and Medicated,,  Endo/Other  negative endocrine ROS    Renal/GU negative Renal ROS  negative genitourinary   Musculoskeletal negative musculoskeletal ROS (+)    Abdominal Normal abdominal exam  (+)   Peds  Hematology negative hematology ROS (+)   Anesthesia Other Findings   Reproductive/Obstetrics negative OB ROS                              Anesthesia Physical Anesthesia Plan  ASA: 1  Anesthesia Plan: General and Regional   Post-op Pain Management: Tylenol  PO (pre-op)*, Regional block*, Ketamine IV*, Precedex  and Dilaudid  IV   Induction: Intravenous  PONV Risk Score and Plan: 2 and Ondansetron , Dexamethasone , Midazolam  and Treatment may vary due to age or medical condition  Airway Management Planned: Oral ETT  Additional Equipment: None  Intra-op Plan:   Post-operative Plan: Extubation in OR  Informed Consent: I have reviewed the patients History and Physical, chart, labs and discussed the procedure including the risks, benefits and alternatives for the proposed anesthesia with the patient or authorized representative who has indicated his/her understanding and acceptance.     Dental advisory given  Plan Discussed with: CRNA  Anesthesia Plan Comments:          Anesthesia Quick Evaluation

## 2024-09-20 NOTE — Transfer of Care (Addendum)
 Immediate Anesthesia Transfer of Care Note  Patient: Gerald Neal  Procedure(s) Performed: REPAIR, HERNIA, INGUINAL, ROBOT-ASSISTED, LAPAROSCOPIC, USING MESH (Right: Pelvis)  Patient Location: PACU  Anesthesia Type:General  Level of Consciousness: awake, alert , and oriented  Airway & Oxygen Therapy: Patient Spontanous Breathing and Patient connected to face mask oxygen  Post-op Assessment: Report given to RN and Post -op Vital signs reviewed and stable  Post vital signs: Reviewed and stable  Last Vitals:  Vitals Value Taken Time  BP 113/67 09/20/24 17:19  Temp 36.4 C 09/20/24 16:30  Pulse 60 09/20/24 17:24  Resp 14 09/20/24 17:24  SpO2 100 % 09/20/24 17:24  Vitals shown include unfiled device data.  Last Pain:  Vitals:   09/20/24 1715  TempSrc:   PainSc: 4          Complications: No notable events documented.

## 2024-09-20 NOTE — H&P (Signed)
     Gerald Neal Gerald Neal [birth year]  [id number].    HPI:  23 y/o M with a right inguinal hernia who presents for elective repair. He reports that he is in his usual state of health and denies any recent changes in medication.   ROS: Review of Systems  Constitutional: Negative.   HENT: Negative.    Eyes: Negative.   Respiratory: Negative.    Cardiovascular: Negative.   Gastrointestinal: Negative.   Genitourinary: Negative.   Musculoskeletal: Negative.   Skin: Negative.   Neurological: Negative.   Endo/Heme/Allergies: Negative.   Psychiatric/Behavioral: Negative.      Family History  Problem Relation Age of Onset   Diabetes Maternal Grandfather    Diabetes Paternal Grandfather    Healthy Mother    Healthy Father    Healthy Sister    Allergic rhinitis Neg Hx    Asthma Neg Hx    Eczema Neg Hx    Urticaria Neg Hx     Past Medical History:  Diagnosis Date   Acute left ankle pain 05/19/2023   Chest pain at rest    Deviated septum 08/2018   Fatty liver    GERD (gastroesophageal reflux disease) 2022   Inguinal hernia    Migraines    Recurrent urticaria 01/21/2019   Runny nose 09/18/2018   clear drainage, per mother   Rupture of left plantaris tendon 01/11/2023   Sore throat 09/18/2018    Past Surgical History:  Procedure Laterality Date   APPENDECTOMY  10/31/2003   CENTRAL LINE INSERTION Right 10/31/2003   COLONOSCOPY  08/2024   INGUINAL HERNIA REPAIR Left 03/30/2004   LAPAROSCOPY N/A 11/11/2016   Procedure: LAPAROSCOPY DIAGNOSTIC  AND EXPLORATORY LYSIS OF ADHESION;  Surgeon: Julietta Millman, MD;  Location: [facility] OR;  Service: General;  Laterality: N/A;   SEPTOPLASTY  03/2018   SEPTOPLASTY N/A 09/25/2018   Procedure: REVISION SEPTOPLASTY;  Surgeon: Karis Clunes, MD;  Location:  SUR[facility]ERY C[facility];  Service: ENT;  Laterality: N/A;   UPPER GI ENDOSCOPY  08/2024   WISDOM TOOTH EXTRACTION      Social History:  reports that he has never smoked. He has never  used smokeless tobacco. He reports that he does not drink alcohol and does not use drugs.  Allergies: No Known Allergies  Medications Prior to Admission  Medication Sig Dispense Refill   cetirizine  (ZYRTEC  ALLERGY ) 10 MG tablet Take 1 tablet (10 mg total) by mouth daily as needed for allergies. 30 tablet 0   rizatriptan  (MAXALT -MLT) 10 MG disintegrating tablet Take 10 mg by mouth as needed for migraine.      Physical Exam: Blood pressure 116/70, pulse 72, temperature 98.5 F (36.9 C), temperature source Oral, resp. rate 16, height 5' 8 (1.727 m), weight 65.8 kg, SpO2 98%. Gen: male, NAD Abd: soft, non-distended, right groin marked  No results found for this or any previous visit (from the past 48 hours). No results found.  Assessment/Plan 23 y/o M with a right inguinal hernia  - Will proceed to the OR. We discussed the alternatives and potential risks of surgery, including but not limited to: bleeding, infection, damage to bowel or surrounding structures, damage to the vas/cord, mesh complications, chronic pain, recurrent hernia, and need for additional procedures. All questions were addressed and consent was obtained.    Cordella DELENA Polly Marlis Cheron Surgery 09/20/2024, 1:33 PM Please see Amion for pager number during day hours 7:00am-4:30pm or 7:00am -11:30am on weekends

## 2024-09-20 NOTE — Addendum Note (Signed)
 Addendum  created 09/20/24 1750 by Cena Epps, CRNA   Intraprocedure Staff edited

## 2024-09-20 NOTE — Anesthesia Postprocedure Evaluation (Signed)
 Anesthesia Post Note  Patient: Gerald Neal  Procedure(s) Performed: REPAIR, HERNIA, INGUINAL, ROBOT-ASSISTED, LAPAROSCOPIC, USING MESH (Right: Pelvis)     Patient location during evaluation: PACU Anesthesia Type: Regional and General Level of consciousness: awake and alert Pain management: pain level controlled Vital Signs Assessment: post-procedure vital signs reviewed and stable Respiratory status: spontaneous breathing, nonlabored ventilation, respiratory function stable and patient connected to nasal cannula oxygen Cardiovascular status: blood pressure returned to baseline and stable Postop Assessment: no apparent nausea or vomiting Anesthetic complications: no   No notable events documented.  Last Vitals:  Vitals:   09/20/24 1727 09/20/24 1730  BP:  110/63  Pulse: 64 (!) 58  Resp: 18 20  Temp:  36.7 C  SpO2: 100% 100%    Last Pain:  Vitals:   09/20/24 1730  TempSrc:   PainSc: 5                  Suren Payne P Addie Alonge

## 2024-09-20 NOTE — Anesthesia Procedure Notes (Signed)
 Anesthesia Regional Block: TAP block   Pre-Anesthetic Checklist: , timeout performed,  Correct Patient, Correct Site, Correct Laterality,  Correct Procedure, Correct Position, site marked,  Risks and benefits discussed,  Surgical consent,  Pre-op evaluation,  At surgeon's request and post-op pain management  Laterality: Right  Prep: Dura Prep       Needles:  Injection technique: Single-shot  Needle Type: Echogenic Stimulator Needle     Needle Length: 10cm  Needle Gauge: 20     Additional Needles:   Procedures:,,,, ultrasound used (permanent image in chart),,    Narrative:  Start time: 09/20/2024 2:05 PM End time: 09/20/2024 2:09 PM Injection made incrementally with aspirations every 5 mL.  Performed by: Personally  Anesthesiologist: Dorethea Cordella SQUIBB, DO  Additional Notes: Patient identified. Risks/Benefits/Options discussed with patient including but not limited to bleeding, infection, nerve damage, failed block, incomplete pain control. Patient expressed understanding and wished to proceed. All questions were answered. Sterile technique was used throughout the entire procedure. Please see nursing notes for vital signs. Aspirated in 5cc intervals with injection for negative confirmation. Patient was given instructions on fall risk and not to get out of bed. All questions and concerns addressed with instructions to call with any issues or inadequate analgesia.

## 2024-09-23 ENCOUNTER — Encounter (HOSPITAL_COMMUNITY): Payer: Self-pay | Admitting: General Surgery

## 2024-09-24 ENCOUNTER — Encounter (HOSPITAL_COMMUNITY): Payer: Self-pay | Admitting: General Surgery

## 2024-10-03 ENCOUNTER — Ambulatory Visit (INDEPENDENT_AMBULATORY_CARE_PROVIDER_SITE_OTHER)

## 2024-10-03 VITALS — BP 96/63 | HR 83 | Temp 98.1°F | Resp 16 | Wt 136.0 lb

## 2024-10-03 DIAGNOSIS — Z9889 Other specified postprocedural states: Secondary | ICD-10-CM | POA: Diagnosis not present

## 2024-10-03 DIAGNOSIS — H538 Other visual disturbances: Secondary | ICD-10-CM | POA: Diagnosis not present

## 2024-10-03 DIAGNOSIS — Z712 Person consulting for explanation of examination or test findings: Secondary | ICD-10-CM

## 2024-10-03 NOTE — Progress Notes (Unsigned)
    Patient ID: Gerald Neal, male    DOB: 2001-02-01  MRN: [id number]  CC: Medical Management of Chronic Issues   Subjective: Gerald Neal is a 23 y.o. male who presents to clinic for follow up on various health concerns. PT is 2 weeks post op for hernia repair. Reports minor pain along incisions. Denies drainage, fever or redness. Pt continues to be concerned GI discomfort and wants to seek second opinion. Completed endoscopy and colonoscopy. Per pt reports, no abnormal findings or further follow up.  Pt reports blurry vision for over 3 months, reports vision feels uneven on right eye. Requested referral to ophthalmology. Denies recent injury, vision loss or pain in eyes.   ROS: Review of Systems Negative except as stated above  PHYSICAL EXAM: BP 96/63   Pulse 83   Temp 98.1 F (36.7 C) (Oral)   Resp 16   Wt 136 lb (61.7 kg)   SpO2 98%   BMI 20.68 kg/m   Physical Exam  General: well-appearing, no acute distress Skin: no jaundice, rashes, or lesions Cardiovascular: regular heart rate and rhythm Chest: lungs clear to auscultation bilaterally, equal breath sounds bilaterally Abdomen: soft, non-distended, non-tender to palpation, normoactive bowel sounds. Incisions intact with glue. No erythema, edema or other signs of infection Musculoskeletal:normal gait Extremities: no peripheral edema    ASSESSMENT AND PLAN:  1. Post-operative state (Primary) - Incisions healed, no signs of infection. Minimal pain. Has follow up in 2 weeks.   2. Encounter to discuss test results  3. Blurry vision, right eye - Ambulatory referral to Ophthalmology    Patient was given the opportunity to ask questions.  Patient verbalized understanding of the plan and was able to repeat key elements of the plan.    Orders Placed This Encounter  Procedures   Ambulatory referral to Ophthalmology     Requested Prescriptions    No prescriptions requested or ordered in this  encounter    Return in about 3 months (around 01/03/2025) for follow-up.  Sula Leavy Rode, PA-C

## 2024-10-28 ENCOUNTER — Encounter: Payer: Self-pay | Admitting: Radiology

## 2025-01-03 ENCOUNTER — Ambulatory Visit

## 2025-01-17 ENCOUNTER — Ambulatory Visit

## 2025-01-17 VITALS — BP 108/71 | HR 62 | Temp 98.5°F | Resp 16 | Ht 68.0 in | Wt 140.4 lb

## 2025-01-17 DIAGNOSIS — F99 Mental disorder, not otherwise specified: Secondary | ICD-10-CM | POA: Diagnosis not present

## 2025-01-17 NOTE — Progress Notes (Signed)
" ° ° ° °  Patient ID: Gerald Neal, male    DOB: 24-Aug-2001  MRN: [id number]  CC: Medical Management of Chronic Issues   Subjective: Gerald Neal is a 24 y.o. male who presents to clinic for follow up on general health.  Patient is concerned for his mental health reports that for the past 3 months he has been partaking in risky behaviors such as smoking marijuana on a daily basis, vaping and spending a lot of money which has led to a lot of debt.  Patient reports that prior to this episode he was losing interest in his schoolwork which led to him failing his classes and having to drop out of school. Additionally prior to this, reports periods of time where he had very little interesting in eating, showering, brushing teeth and other activities of daily living.  Patient reports he has experienced depression in the past.  Denies suicidal thoughts or ideations.  Allergies[1]  ROS: Review of Systems Negative except as stated above  PHYSICAL EXAM: BP 108/71   Pulse 62   Temp 98.5 F (36.9 C) (Oral)   Resp 16   Ht 5' 8 (1.727 m)   Wt 140 lb 6.4 oz (63.7 kg)   SpO2 98%   BMI 21.35 kg/m   Physical Exam  General: well-appearing, no acute distress Skin: no jaundice, rashes, or lesions Cardiovascular: regular heart rate and rhythm, normal S1/S2, no murmurs, gallops, or rubs, peripheral pulses 2+ bilaterally Chest: no skeletal deformity, lungs clear to auscultation bilaterally, equal breath sounds bilaterally Musculoskeletal: normal gait Extremities: no peripheral edema  ASSESSMENT AND PLAN:  1. Mental health impairment (Primary) - Concern for bipolar disorder given history of manic and depressive episodes.  Referral to psychiatry for proper evaluation and therapeutic management. - Ambulatory referral to Psychiatry  Patient was given the opportunity to ask questions.  Patient verbalized understanding of the plan and was able to repeat key elements of the plan.     Orders Placed This Encounter  Procedures   Ambulatory referral to Psychiatry     Return in about 3 months (around 04/17/2025) for follow-up .  Sula Cower Lerae Langham, PA-C      [1] No Known Allergies  "

## 2025-02-08 ENCOUNTER — Ambulatory Visit (HOSPITAL_COMMUNITY): Admitting: Psychiatry

## 2025-04-17 ENCOUNTER — Ambulatory Visit: Payer: Self-pay
